# Patient Record
Sex: Female | Born: 1990
Health system: Southern US, Community
[De-identification: ages and names within clinical notes are randomized; demographics above are authoritative.]

## PROBLEM LIST (undated history)

## (undated) ENCOUNTER — Inpatient Hospital Stay (HOSPITAL_COMMUNITY): Payer: Self-pay

## (undated) DIAGNOSIS — K8020 Calculus of gallbladder without cholecystitis without obstruction: Secondary | ICD-10-CM

## (undated) DIAGNOSIS — F419 Anxiety disorder, unspecified: Secondary | ICD-10-CM

## (undated) DIAGNOSIS — R011 Cardiac murmur, unspecified: Secondary | ICD-10-CM

## (undated) DIAGNOSIS — B977 Papillomavirus as the cause of diseases classified elsewhere: Secondary | ICD-10-CM

## (undated) HISTORY — DX: Cardiac murmur, unspecified: R01.1

## (undated) HISTORY — DX: Calculus of gallbladder without cholecystitis without obstruction: K80.20

## (undated) HISTORY — PX: WISDOM TOOTH EXTRACTION: SHX21

---

## 2016-11-18 ENCOUNTER — Encounter: Payer: Self-pay | Admitting: Emergency Medicine

## 2016-11-18 ENCOUNTER — Emergency Department: Payer: Self-pay

## 2016-11-18 DIAGNOSIS — R079 Chest pain, unspecified: Secondary | ICD-10-CM | POA: Insufficient documentation

## 2016-11-18 DIAGNOSIS — R0602 Shortness of breath: Secondary | ICD-10-CM | POA: Insufficient documentation

## 2016-11-18 DIAGNOSIS — R110 Nausea: Secondary | ICD-10-CM | POA: Insufficient documentation

## 2016-11-18 DIAGNOSIS — Z5321 Procedure and treatment not carried out due to patient leaving prior to being seen by health care provider: Secondary | ICD-10-CM | POA: Insufficient documentation

## 2016-11-18 DIAGNOSIS — F17200 Nicotine dependence, unspecified, uncomplicated: Secondary | ICD-10-CM | POA: Insufficient documentation

## 2016-11-18 LAB — CBC
HCT: 41.1 % (ref 35.0–47.0)
Hemoglobin: 14.1 g/dL (ref 12.0–16.0)
MCH: 30.3 pg (ref 26.0–34.0)
MCHC: 34.2 g/dL (ref 32.0–36.0)
MCV: 88.6 fL (ref 80.0–100.0)
Platelets: 260 10*3/uL (ref 150–440)
RBC: 4.64 MIL/uL (ref 3.80–5.20)
RDW: 13.1 % (ref 11.5–14.5)
WBC: 9.6 10*3/uL (ref 3.6–11.0)

## 2016-11-18 LAB — BASIC METABOLIC PANEL WITH GFR
Anion gap: 7 (ref 5–15)
BUN: 14 mg/dL (ref 6–20)
CO2: 26 mmol/L (ref 22–32)
Calcium: 9.4 mg/dL (ref 8.9–10.3)
Chloride: 105 mmol/L (ref 101–111)
Creatinine, Ser: 0.63 mg/dL (ref 0.44–1.00)
GFR calc Af Amer: >60 mL/min (ref >60)
GFR calc non Af Amer: >60 mL/min (ref >60)
Glucose, Bld: 93 mg/dL (ref 65–99)
Potassium: 3.7 mmol/L (ref 3.5–5.1)
Sodium: 138 mmol/L (ref 135–145)

## 2016-11-18 LAB — TROPONIN I

## 2016-11-18 NOTE — ED Triage Notes (Signed)
Patient with complaint of shortness of breath that started around 4 days ago. Patient with central chest pain that started about 2 days ago. Patient states that she also has intermittent nausea.

## 2016-11-19 ENCOUNTER — Emergency Department
Admission: EM | Admit: 2016-11-19 | Discharge: 2016-11-19 | Disposition: A | Discharge status: Home / Self Care | Payer: Self-pay | Attending: Emergency Medicine | Admitting: Emergency Medicine

## 2017-01-15 ENCOUNTER — Encounter (HOSPITAL_COMMUNITY): Payer: Self-pay

## 2017-01-15 ENCOUNTER — Inpatient Hospital Stay (HOSPITAL_COMMUNITY)
Admission: AD | Admit: 2017-01-15 | Discharge: 2017-01-15 | Disposition: A | Discharge status: Home / Self Care | Payer: Managed Care, Other (non HMO) | Source: Ambulatory Visit | Attending: Obstetrics and Gynecology | Admitting: Obstetrics and Gynecology

## 2017-01-15 DIAGNOSIS — Z3A11 11 weeks gestation of pregnancy: Secondary | ICD-10-CM | POA: Diagnosis not present

## 2017-01-15 DIAGNOSIS — J029 Acute pharyngitis, unspecified: Secondary | ICD-10-CM | POA: Diagnosis not present

## 2017-01-15 DIAGNOSIS — J069 Acute upper respiratory infection, unspecified: Secondary | ICD-10-CM

## 2017-01-15 DIAGNOSIS — O26891 Other specified pregnancy related conditions, first trimester: Secondary | ICD-10-CM | POA: Diagnosis not present

## 2017-01-15 HISTORY — DX: Papillomavirus as the cause of diseases classified elsewhere: B97.7

## 2017-01-15 LAB — CBC WITH DIFFERENTIAL/PLATELET
BASOS PCT: 0 %
Basophils Absolute: 0 10*3/uL (ref 0.0–0.1)
EOS ABS: 0.2 10*3/uL (ref 0.0–0.7)
EOS PCT: 1 %
HCT: 36.1 % (ref 36.0–46.0)
Hemoglobin: 12.2 g/dL (ref 12.0–15.0)
Lymphocytes Relative: 16 %
Lymphs Abs: 2.2 10*3/uL (ref 0.7–4.0)
MCH: 30.2 pg (ref 26.0–34.0)
MCHC: 33.8 g/dL (ref 30.0–36.0)
MCV: 89.4 fL (ref 78.0–100.0)
MONO ABS: 0.4 10*3/uL (ref 0.1–1.0)
MONOS PCT: 3 %
Neutro Abs: 11.3 10*3/uL — ABNORMAL HIGH (ref 1.7–7.7)
Neutrophils Relative %: 80 %
Platelets: 293 10*3/uL (ref 150–400)
RBC: 4.04 MIL/uL (ref 3.87–5.11)
RDW: 13.2 % (ref 11.5–15.5)
WBC: 14.1 10*3/uL — ABNORMAL HIGH (ref 4.0–10.5)

## 2017-01-15 LAB — URINALYSIS, ROUTINE W REFLEX MICROSCOPIC
BILIRUBIN URINE: NEGATIVE
Glucose, UA: NEGATIVE mg/dL
KETONES UR: NEGATIVE mg/dL
Leukocytes, UA: NEGATIVE
NITRITE: NEGATIVE
PROTEIN: NEGATIVE mg/dL
pH: 6 (ref 5.0–8.0)

## 2017-01-15 LAB — POCT PREGNANCY, URINE: PREG TEST UR: POSITIVE — AB

## 2017-01-15 LAB — URINALYSIS, MICROSCOPIC (REFLEX)

## 2017-01-15 LAB — INFLUENZA PANEL BY PCR (TYPE A & B)
INFLAPCR: NEGATIVE
INFLBPCR: NEGATIVE

## 2017-01-15 LAB — RAPID STREP SCREEN (MED CTR MEBANE ONLY): Streptococcus, Group A Screen (Direct): NEGATIVE

## 2017-01-15 MED ORDER — BENZONATATE 100 MG PO CAPS: 200.0000 mg | ORAL_CAPSULE | Freq: Three times a day (TID) | ORAL | 0 refills | Status: DC | PRN

## 2017-01-15 NOTE — MAU Provider Note (Signed)
History   G3 P1011 @ 11.3 wks in with fever, nausea, vomiting, cough,  and diarrhea. Since Friday.   CSN: [id number]  Arrival date & time 01/15/17  1711   None     Chief Complaint  Patient presents with  . Fever  . Sore Throat  . Headache  . Generalized Body Aches    HPI  Past Medical History:  Diagnosis Date  . HPV in female     Past Surgical History:  Procedure Laterality Date  . WISDOM TOOTH EXTRACTION      History reviewed. No pertinent family history.  Social History  Substance Use Topics  . Smoking status: Former Games developermoker  . Smokeless tobacco: Never Used  . Alcohol use No    OB History    Gravida Para Term Preterm AB Living   3 1 1   1 1    SAB TAB Ectopic Multiple Live Births   1              Review of Systems  Constitutional: Positive for fatigue and fever.  HENT: Negative.   Eyes: Negative.   Respiratory: Positive for cough.   Cardiovascular: Negative.   Gastrointestinal: Positive for nausea and vomiting.  Endocrine: Negative.   Genitourinary: Negative.   Musculoskeletal: Negative.   Skin: Negative.     Allergies  Patient has no known allergies.  Home Medications    BP 125/81 (BP Location: Right Arm)   Pulse 108   Temp 98.2 F (36.8 C) (Oral)   Resp 18   Ht 5' (1.524 m)   Wt 150 lb 12.8 oz (68.4 kg)   LMP 10/28/2016   BMI 29.45 kg/m   Physical Exam  Constitutional: She is oriented to person, place, and time. She appears well-developed and well-nourished.  HENT:  Head: Normocephalic.  Eyes: Pupils are equal, round, and reactive to light.  Neck: Normal range of motion.  Cardiovascular: Normal rate, regular rhythm, normal heart sounds and intact distal pulses.   Pulmonary/Chest: Effort normal and breath sounds normal.  Abdominal: Soft. Bowel sounds are normal.  Musculoskeletal: Normal range of motion.  Neurological: She is alert and oriented to person, place, and time. She has normal reflexes.  Skin: Skin is warm and dry.   Psychiatric: She has a normal mood and affect. Her behavior is normal. Judgment and thought content normal.    MAU Course  Procedures (including critical care time)  Labs Reviewed  CBC WITH DIFFERENTIAL/PLATELET - Abnormal; Notable for the following:       Result Value   WBC 14.1 (*)    Neutro Abs 11.3 (*)    All other components within normal limits  POCT PREGNANCY, URINE - Abnormal; Notable for the following:    Preg Test, Ur POSITIVE (*)    All other components within normal limits  RAPID STREP SCREEN (NOT AT [facility])  URINALYSIS, ROUTINE W REFLEX MICROSCOPIC  INFLUENZA PANEL BY PCR (TYPE A & B)   No results found.   No diagnosis found.    MDM  VSS, LCTAB, influenza screen neg, rapid strep neg. Will d/c home with med for cough. POC discussed with Dr. Jackelyn KnifeMeisinger.

## 2017-01-15 NOTE — MAU Note (Addendum)
Patient presents with congestion, headache, body aches, vomiting, sore throat, fever of 100.1 to 100.3 did not get flu shot. Having diarrhea every hour to hour and 1/2

## 2017-01-15 NOTE — MAU Note (Signed)
Pt took robitussin for cough between 1230 & 1400 today, hasn't taken anything for HA, vomiting, or diarrhea.

## 2017-01-15 NOTE — Discharge Instructions (Signed)
Upper Respiratory Infection, Adult Most upper respiratory infections (URIs) are caused by a virus. A URI affects the nose, throat, and upper air passages. The most common type of URI is often called "the common cold." Follow these instructions at home:  Take medicines only as told by your doctor.  Gargle warm saltwater or take cough drops to comfort your throat as told by your doctor.  Use a warm mist humidifier or inhale steam from a shower to increase air moisture. This may make it easier to breathe.  Drink enough fluid to keep your pee (urine) clear or pale yellow.  Eat soups and other clear broths.  Have a healthy diet.  Rest as needed.  Go back to work when your fever is gone or your doctor says it is okay.  You may need to stay home longer to avoid giving your URI to others.  You can also wear a face mask and wash your hands often to prevent spread of the virus.  Use your inhaler more if you have asthma.  Do not use any tobacco products, including cigarettes, chewing tobacco, or electronic cigarettes. If you need help quitting, ask your doctor. Contact a doctor if:  You are getting worse, not better.  Your symptoms are not helped by medicine.  You have chills.  You are getting more short of breath.  You have brown or red mucus.  You have yellow or brown discharge from your nose.  You have pain in your face, especially when you bend forward.  You have a fever.  You have puffy (swollen) neck glands.  You have pain while swallowing.  You have white areas in the back of your throat. Get help right away if:  You have very bad or constant:  Headache.  Ear pain.  Pain in your forehead, behind your eyes, and over your cheekbones (sinus pain).  Chest pain.  You have long-lasting (chronic) lung disease and any of the following:  Wheezing.  Long-lasting cough.  Coughing up blood.  A change in your usual mucus.  You have a stiff neck.  You have  changes in your:  Vision.  Hearing.  Thinking.  Mood. This information is not intended to replace advice given to you by your health care provider. Make sure you discuss any questions you have with your health care provider. Document Released: 04/18/2008 Document Revised: 07/03/2016 Document Reviewed: 02/05/2014 Elsevier Interactive Patient Education  2017 Elsevier Inc.  

## 2017-01-18 LAB — CULTURE, GROUP A STREP (THRC)

## 2017-10-27 ENCOUNTER — Encounter (HOSPITAL_COMMUNITY): Payer: Self-pay

## 2018-06-02 ENCOUNTER — Other Ambulatory Visit: Payer: Self-pay

## 2018-06-02 ENCOUNTER — Encounter (HOSPITAL_COMMUNITY): Payer: Self-pay | Admitting: Emergency Medicine

## 2018-06-02 ENCOUNTER — Emergency Department (HOSPITAL_COMMUNITY): Payer: Medicaid Other

## 2018-06-02 ENCOUNTER — Emergency Department (HOSPITAL_COMMUNITY)
Admission: EM | Admit: 2018-06-02 | Discharge: 2018-06-03 | Disposition: A | Discharge status: Home / Self Care | Payer: Medicaid Other | Attending: Emergency Medicine | Admitting: Emergency Medicine

## 2018-06-02 DIAGNOSIS — G43909 Migraine, unspecified, not intractable, without status migrainosus: Secondary | ICD-10-CM | POA: Insufficient documentation

## 2018-06-02 DIAGNOSIS — Z79899 Other long term (current) drug therapy: Secondary | ICD-10-CM | POA: Insufficient documentation

## 2018-06-02 DIAGNOSIS — F17210 Nicotine dependence, cigarettes, uncomplicated: Secondary | ICD-10-CM | POA: Diagnosis not present

## 2018-06-02 DIAGNOSIS — R51 Headache: Secondary | ICD-10-CM | POA: Diagnosis present

## 2018-06-02 DIAGNOSIS — G43009 Migraine without aura, not intractable, without status migrainosus: Secondary | ICD-10-CM

## 2018-06-02 HISTORY — DX: Anxiety disorder, unspecified: F41.9

## 2018-06-02 LAB — CBC
HCT: 40.8 % (ref 36.0–46.0)
HEMOGLOBIN: 12.8 g/dL (ref 12.0–15.0)
MCH: 28.4 pg (ref 26.0–34.0)
MCHC: 31.4 g/dL (ref 30.0–36.0)
MCV: 90.7 fL (ref 78.0–100.0)
PLATELETS: 310 10*3/uL (ref 150–400)
RBC: 4.5 MIL/uL (ref 3.87–5.11)
RDW: 12.4 % (ref 11.5–15.5)
WBC: 11.4 10*3/uL — AB (ref 4.0–10.5)

## 2018-06-02 LAB — BASIC METABOLIC PANEL
Anion gap: 11 (ref 5–15)
BUN: 21 mg/dL — ABNORMAL HIGH (ref 6–20)
CALCIUM: 9.3 mg/dL (ref 8.9–10.3)
CHLORIDE: 101 mmol/L (ref 98–111)
CO2: 26 mmol/L (ref 22–32)
CREATININE: 0.66 mg/dL (ref 0.44–1.00)
GFR calc non Af Amer: >60 mL/min (ref >60)
Glucose, Bld: 113 mg/dL — ABNORMAL HIGH (ref 70–99)
Potassium: 4.2 mmol/L (ref 3.5–5.1)
Sodium: 138 mmol/L (ref 135–145)

## 2018-06-02 LAB — I-STAT BETA HCG BLOOD, ED (MC, WL, AP ONLY): I-stat hCG, quantitative: <5 m[IU]/mL (ref <5)

## 2018-06-02 LAB — CBG MONITORING, ED: GLUCOSE-CAPILLARY: 114 mg/dL — AB (ref 70–99)

## 2018-06-02 MED ORDER — KETOROLAC TROMETHAMINE 30 MG/ML IJ SOLN
30.0000 mg | Freq: Once | INTRAMUSCULAR | Status: AC
  Administered 2018-06-02: 30 mg via INTRAVENOUS
  Filled 2018-06-02: qty 1

## 2018-06-02 MED ORDER — SODIUM CHLORIDE 0.9 % IV BOLUS
1000.0000 mL | Freq: Once | INTRAVENOUS | Status: AC
  Administered 2018-06-02: 1000 mL via INTRAVENOUS

## 2018-06-02 MED ORDER — DIPHENHYDRAMINE HCL 50 MG/ML IJ SOLN
25.0000 mg | Freq: Once | INTRAMUSCULAR | Status: AC
  Administered 2018-06-02: 25 mg via INTRAVENOUS
  Filled 2018-06-02: qty 1

## 2018-06-02 MED ORDER — PROCHLORPERAZINE EDISYLATE 10 MG/2ML IJ SOLN
10.0000 mg | Freq: Once | INTRAMUSCULAR | Status: AC
Start: 2018-06-02 — End: 2018-06-02
  Administered 2018-06-02: 10 mg via INTRAVENOUS
  Filled 2018-06-02: qty 2
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
## 2018-06-02 NOTE — ED Notes (Signed)
Patient transported to CT 
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
## 2018-06-02 NOTE — ED Notes (Signed)
ED Provider at bedside. 

## 2018-06-03 NOTE — Discharge Instructions (Signed)
Return to the emergency room for any worsening symptoms, severe headache, lightheadedness, loss of consciousness, chest pain or shortness of breath.

## 2018-08-09 ENCOUNTER — Encounter (HOSPITAL_BASED_OUTPATIENT_CLINIC_OR_DEPARTMENT_OTHER): Payer: Self-pay | Admitting: Emergency Medicine

## 2018-08-09 ENCOUNTER — Emergency Department (HOSPITAL_BASED_OUTPATIENT_CLINIC_OR_DEPARTMENT_OTHER)
Admission: EM | Admit: 2018-08-09 | Discharge: 2018-08-09 | Disposition: A | Discharge status: Home / Self Care | Payer: Medicaid Other | Attending: Emergency Medicine | Admitting: Emergency Medicine

## 2018-08-09 ENCOUNTER — Other Ambulatory Visit: Payer: Self-pay

## 2018-08-09 DIAGNOSIS — F17200 Nicotine dependence, unspecified, uncomplicated: Secondary | ICD-10-CM | POA: Diagnosis not present

## 2018-08-09 DIAGNOSIS — H538 Other visual disturbances: Secondary | ICD-10-CM | POA: Insufficient documentation

## 2018-08-09 DIAGNOSIS — R1110 Vomiting, unspecified: Secondary | ICD-10-CM | POA: Diagnosis not present

## 2018-08-09 DIAGNOSIS — F419 Anxiety disorder, unspecified: Secondary | ICD-10-CM | POA: Diagnosis not present

## 2018-08-09 DIAGNOSIS — R202 Paresthesia of skin: Secondary | ICD-10-CM

## 2018-08-09 DIAGNOSIS — R079 Chest pain, unspecified: Secondary | ICD-10-CM | POA: Diagnosis not present

## 2018-08-09 LAB — URINALYSIS, ROUTINE W REFLEX MICROSCOPIC
Bilirubin Urine: NEGATIVE
GLUCOSE, UA: NEGATIVE mg/dL
KETONES UR: NEGATIVE mg/dL
LEUKOCYTES UA: NEGATIVE
NITRITE: NEGATIVE
PH: 6 (ref 5.0–8.0)
Protein, ur: NEGATIVE mg/dL
Specific Gravity, Urine: 1.02 (ref 1.005–1.030)

## 2018-08-09 LAB — PREGNANCY, URINE: Preg Test, Ur: NEGATIVE

## 2018-08-09 LAB — URINALYSIS, MICROSCOPIC (REFLEX)

## 2018-08-09 NOTE — Discharge Instructions (Addendum)
Please see your primary doctor in next 2 weeks if there is no improvement in your symptoms   RETURN IMMEDIATELY IF you develop a sudden, severe headache or confusion, become poorly responsive or faint, develop a fever above 100.10F or problem breathing, have a change in speech, vision, swallowing, or understanding, or develop new weakness, numbness, tingling, incoordination, or have a seizure.

## 2018-08-09 NOTE — ED Triage Notes (Signed)
PT is c/o nausea and vomiting for past 4 days  Pt states she has been having chest pain off and on for the past month  Feels like something is crushing her chest and sometimes her left arm goes numb  Pt has hx of chronic migraines  Pt states lately she gets a sensation that runs up the back of her neck then up the back of her head and runs to the front of her head  Pt states it feels like a tickling sensation  Pt states when that happens her head will start shaking  Pt has hx of anxiety

## 2018-08-10 NOTE — ED Provider Notes (Signed)
[facility] EMERGENCY DEPARTMENT Provider Note   CSN: [id number] Arrival date & time: 08/09/18  2100     History   Chief Complaint Chief Complaint  Patient presents with  . Emesis  . Chest Pain    HPI Sandra Khan is a 27 y.o. female.  The history is provided by the patient.  Emesis   This is a new problem. The problem has been gradually improving. There has been no fever. Pertinent negatives include no diarrhea and no fever.   Patient presents for multiple complaints.  She reports over 2 days ago she had an episode of chest pain with numbness in her left arm, but none at this time.  She also reports at times she feels like someone is tickling her neck and the sensation runs into her head that her head starts shaking.  No severe headache associated with this.  She reports at times she may have blurred vision, but denies visual loss, denies diplopia.  No focal weakness.  No speech deficit. No falls. Reports vomiting tonight.  She was history of chronic migraines but none recently. Also reports frequent episodes of anxiety, but refused to take medications due to sleepiness She reports she would like to be checked for COPD, stroke, heart attack Past Medical History:  Diagnosis Date  . Anxiety   . HPV in female     There are no active problems to display for this patient.   Past Surgical History:  Procedure Laterality Date  . WISDOM TOOTH EXTRACTION       OB History    Gravida  3   Para  1   Term  1   Preterm      AB  1   Living  1     SAB  1   TAB      Ectopic      Multiple      Live Births               Home Medications    Prior to Admission medications   Medication Sig Start Date End Date Taking? Authorizing Provider  benzonatate (TESSALON PERLES) 100 MG capsule Take 2 capsules (200 mg total) by mouth 3 (three) times daily as needed for cough. Patient not taking: Reported on 06/02/2018 01/15/17   Katrinka Blazing, [place], CNM    ibuprofen (ADVIL,MOTRIN) 200 MG tablet Take 800 mg by mouth every 6 (six) hours as needed for headache.    [provider]    Family History History reviewed. No pertinent family history.  Social History Social History   Tobacco Use  . Smoking status: Current Every Day Smoker  . Smokeless tobacco: Never Used  Substance Use Topics  . Alcohol use: No  . Drug use: No     Allergies   Patient has no known allergies.   Review of Systems Review of Systems  Constitutional: Negative for fever.  Gastrointestinal: Negative for diarrhea.  Genitourinary: Negative for dysuria.  Musculoskeletal: Positive for neck pain.  Neurological: Negative for weakness.  All other systems reviewed and are negative.    Physical Exam Updated Vital Signs BP 121/74 (BP Location: Right Arm)   Pulse 70   Temp 98.2 F (36.8 C) (Oral)   Resp 16   Ht 1.549 m (5\' 1" )   Wt 73 kg   LMP 08/02/2018 (Exact Date)   SpO2 100%   BMI 30.42 kg/m   Physical Exam CONSTITUTIONAL: Well developed/well nourished HEAD: Normocephalic/atraumatic EYES: EOMI/PERRL, no nystagmus, no  ptosis ENMT: Mucous membranes moist NECK: supple no meningeal signs, no bruits SPINE/BACK:entire spine nontender CV: S1/S2 noted, no murmurs/rubs/gallops noted LUNGS: Lungs are clear to auscultation bilaterally, no apparent distress ABDOMEN: soft, nontender, no rebound or guarding GU:no cva tenderness NEURO:Awake/alert, face symmetric, no arm or leg drift is noted Equal 5/5 strength with shoulder abduction, elbow flex/extension, wrist flex/extension in upper extremities and equal hand grips bilaterally Equal 5/5 strength with hip flexion,knee flex/extension, foot dorsi/plantar flexion Cranial nerves 3/4/5/6/05/22/09/11/12 tested and intact Gait normal without ataxia No past pointing Sensation to light touch intact in all extremities EXTREMITIES: pulses normal, full ROM SKIN: warm, color normal PSYCH: no abnormalities of  mood noted, alert and oriented to situation    ED Treatments / Results  Labs (all labs ordered are listed, but only abnormal results are displayed) Labs Reviewed  URINALYSIS, ROUTINE W REFLEX MICROSCOPIC - Abnormal; Notable for the following components:      Result Value   APPearance HAZY (*)    Hgb urine dipstick TRACE (*)    All other components within normal limits  URINALYSIS, MICROSCOPIC (REFLEX) - Abnormal; Notable for the following components:   Bacteria, UA MANY (*)    All other components within normal limits  PREGNANCY, URINE    EKG EKG Interpretation  Date/Time:  Thursday August 09 2018 21:18:36 EDT Ventricular Rate:  87 PR Interval:    QRS Duration: 81 QT Interval:  349 QTC Calculation: 420 R Axis:   69 Text Interpretation:  Sinus rhythm No significant change since last tracing Confirmed by Melene Plan [phone]) on 08/09/2018 10:43:15 PM   Radiology No results found.  Procedures Procedures  Medications Ordered in ED Medications - No data to display   Initial Impression / Assessment and Plan / ED Course  I have reviewed the triage vital signs and the nursing notes.  Pertinent labs  results that were available during my care of the patient were reviewed by me and considered in my medical decision making (see chart for details).     She is very well-appearing. She has no focal neuro deficits.  She is awake alert and ambulatory.  She has no symptoms at this time.  Defer further work-up.  I did advise her close follow-up with her PCP, she may need to have further neuroimaging if the symptoms in her neck or head begin.  At this time she has no signs of any acute neurologic emergency. For chest pain, no EKG changes, no recent chest pain the past 2 days.  Defer further work-up Final Clinical Impressions(s) / ED Diagnoses   Final diagnoses:  Paresthesia    ED Discharge Orders    None       Zadie Rhine, MD 08/10/18 [phone]

## 2019-05-15 IMAGING — CT CT HEAD W/O CM
4 series · 15 of 47 positions shown, 17 images · non-contrast
Comparison: None.

CLINICAL DATA: 26-year-old female with headache.

EXAM:
CT HEAD WITHOUT CONTRAST
TECHNIQUE: Contiguous axial images were obtained from the base of the skull
through the vertex without intravenous contrast.

[Series 3: head wo · axial · 0.44mm/px · z∈[-183,-63]mm · 7 of 33 slices shown, 9 images]
[im 5/33  brain]
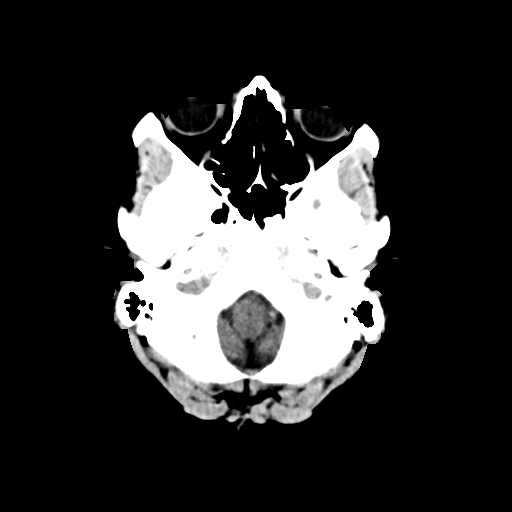
[im 5/33  bone]
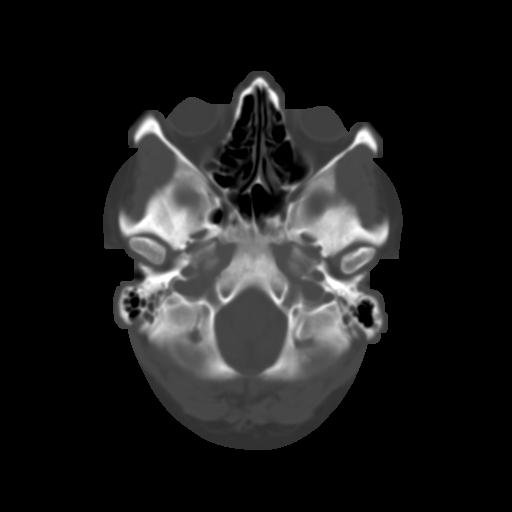
[im 9/33  brain]
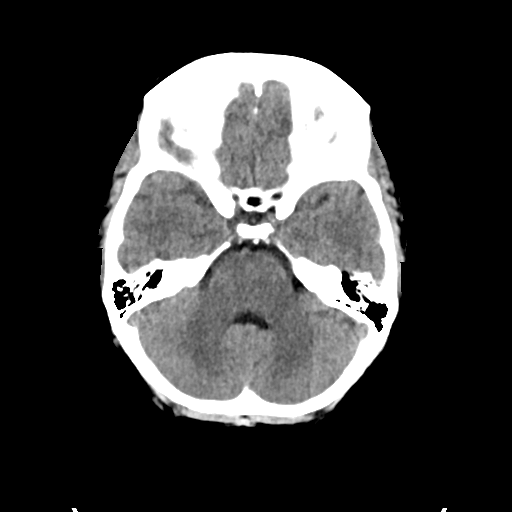
[im 13/33  brain]
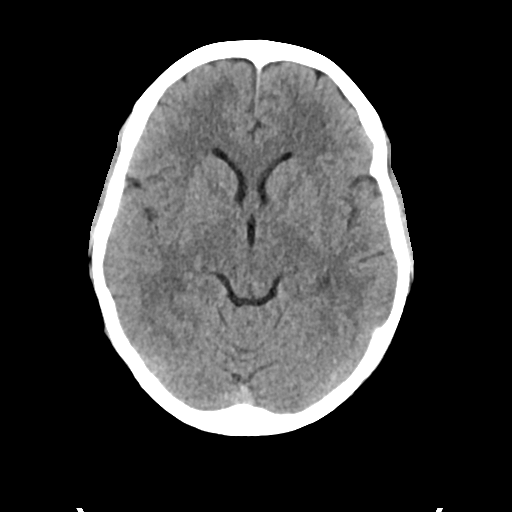
[im 17/33  brain]
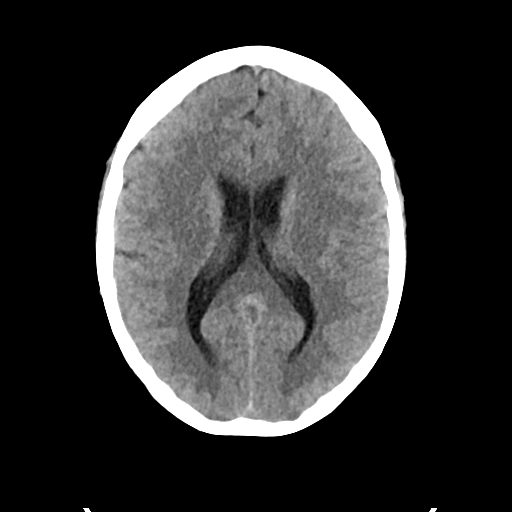
[im 21/33  brain]
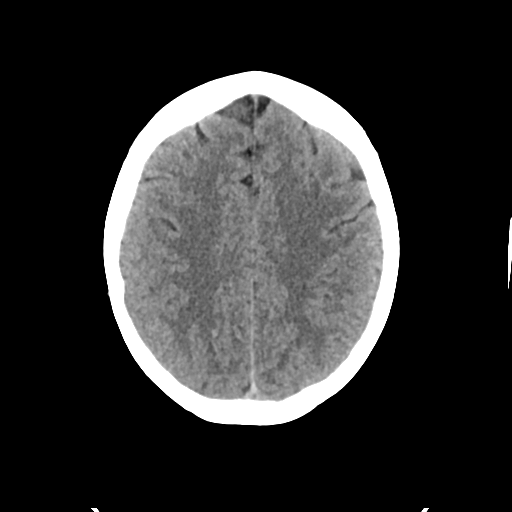
[im 21/33  bone]
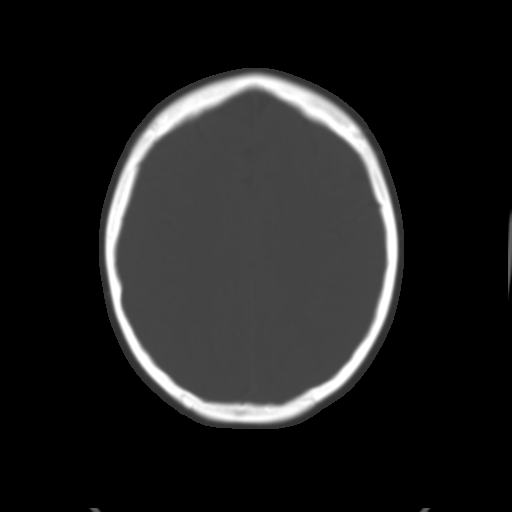
[im 25/33  brain]
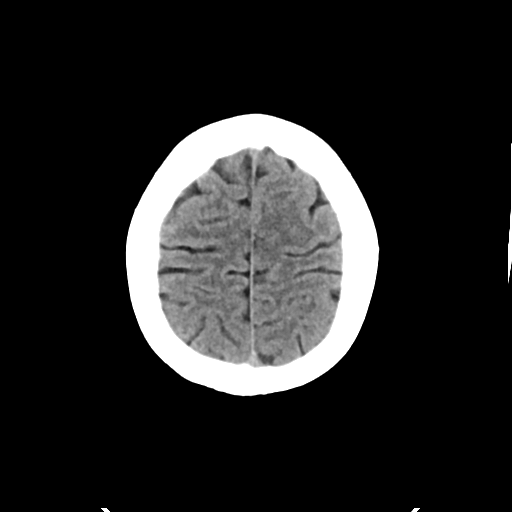
[im 29/33  brain]
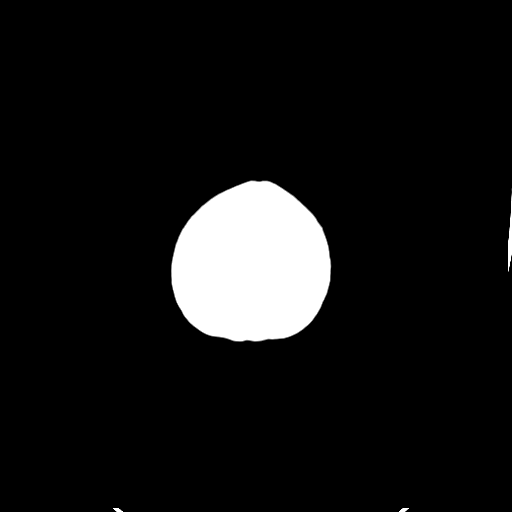

[Series 4: head bone · axial · 0.44mm/px · z∈[-187,-171]mm · 2 of 82 slices shown]
[im 9/82  bone]
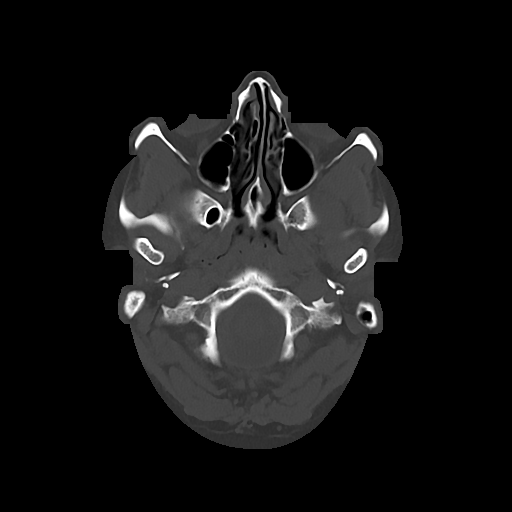
[im 17/82  bone]
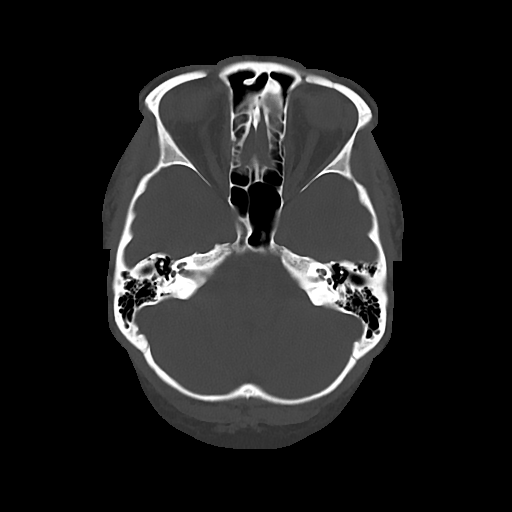

[Series 5: cor soft · coronal · 0.32mm/px · 3 of 67 slices shown]
[im 23/67  brain]
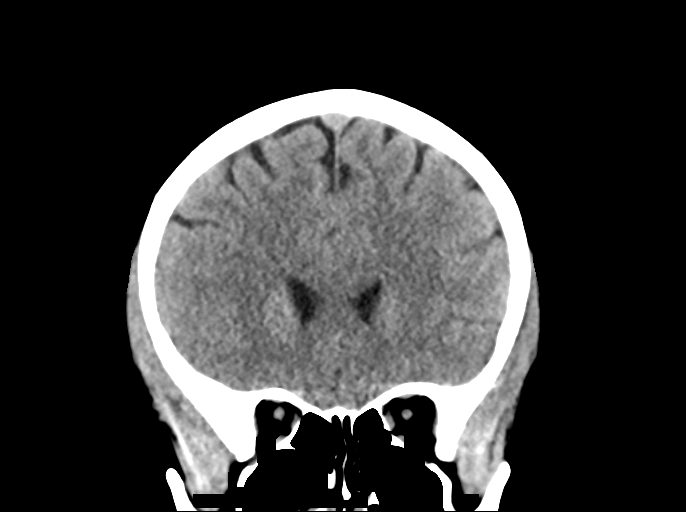
[im 30/67  brain]
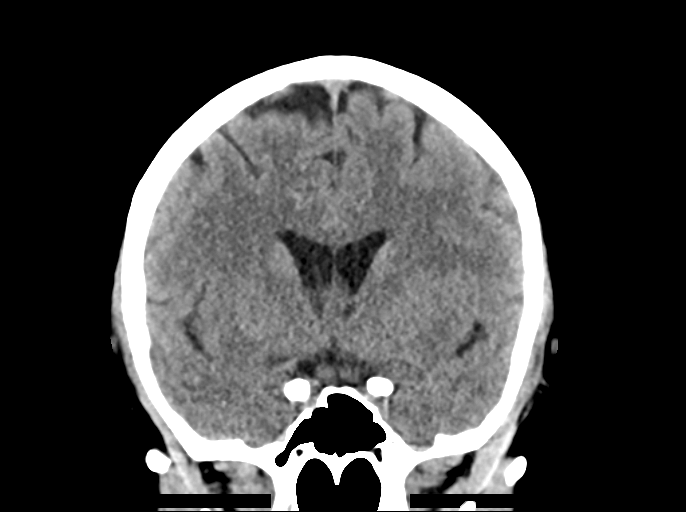
[im 37/67  brain]
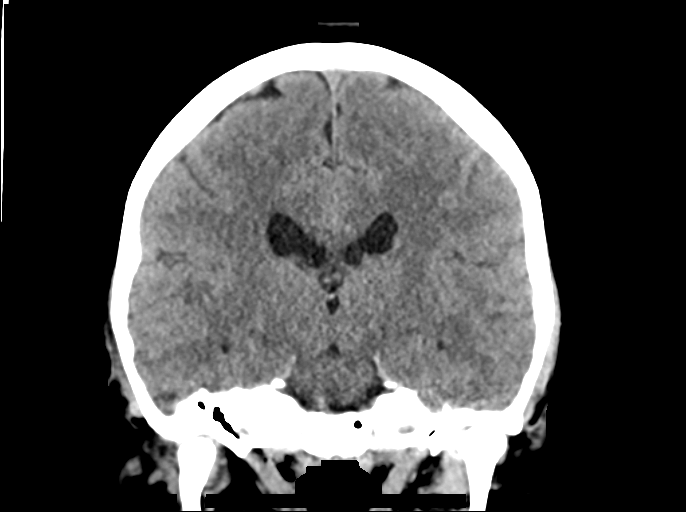

[Series 6: sag soft · sagittal · 0.32mm/px · 3 of 66 slices shown]
[im 22/66  brain]
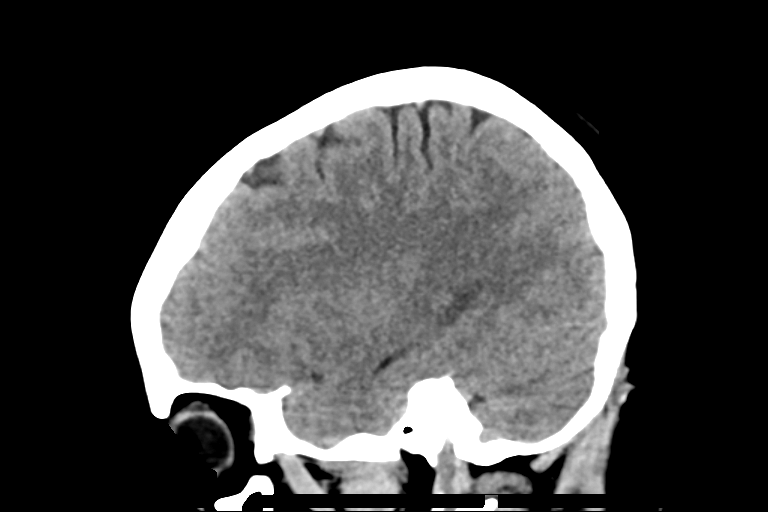
[im 33/66  brain]
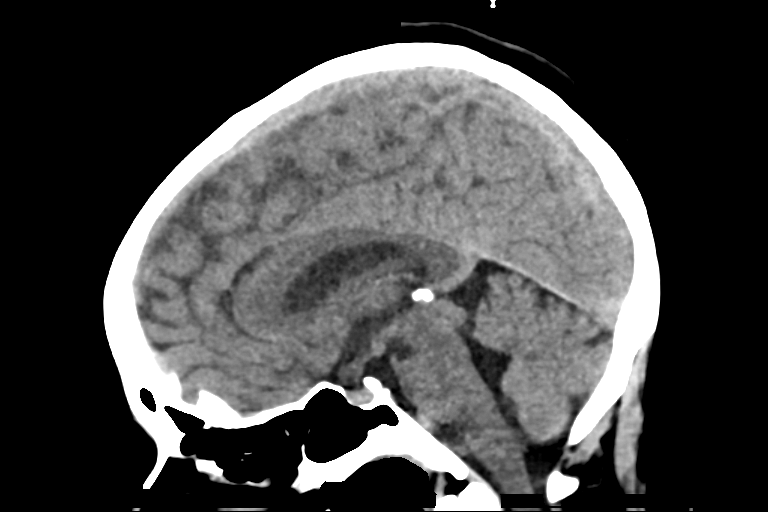
[im 44/66  brain]
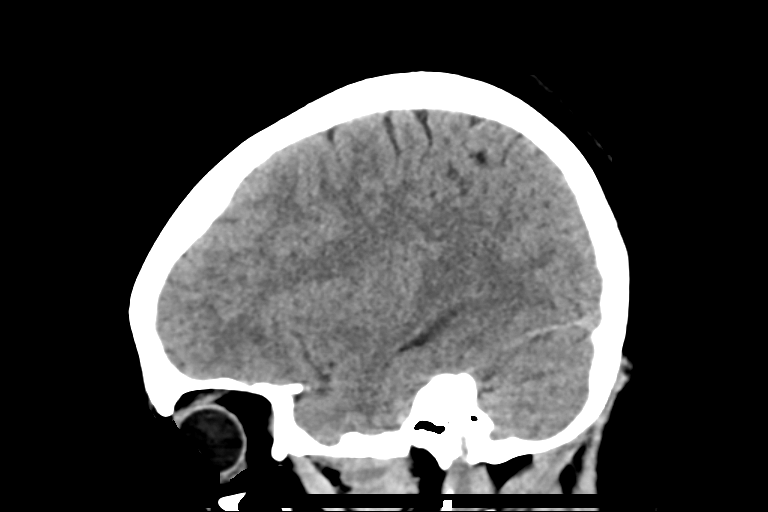

[15 of 47 positions shown; findings below may reference images not displayed]

FINDINGS: Brain: No evidence of acute infarction, hemorrhage, hydrocephalus,
extra-axial collection or mass lesion/mass effect.

Vascular: No hyperdense vessel or unexpected calcification.

Skull: Normal. Negative for fracture or focal lesion.

Sinuses/Orbits: No acute finding.

Other: None
IMPRESSION: Normal noncontrast CT of the brain.

## 2020-11-14 ENCOUNTER — Ambulatory Visit: Admit: 2020-11-14 | Discharge status: Against Medical Advice | Payer: Self-pay

## 2020-11-14 ENCOUNTER — Other Ambulatory Visit: Payer: Self-pay

## 2020-11-14 ENCOUNTER — Ambulatory Visit
Admission: EM | Admit: 2020-11-14 | Discharge: 2020-11-14 | Disposition: A | Discharge status: Home / Self Care | Payer: Medicaid Other | Attending: Emergency Medicine | Admitting: Emergency Medicine

## 2020-11-14 DIAGNOSIS — L500 Allergic urticaria: Secondary | ICD-10-CM | POA: Diagnosis not present

## 2020-11-14 MED ORDER — METHYLPREDNISOLONE SODIUM SUCC 125 MG IJ SOLR
125.0000 mg | Freq: Once | INTRAMUSCULAR | Status: AC
  Administered 2020-11-14: 125 mg via INTRAMUSCULAR

## 2020-11-14 NOTE — ED Triage Notes (Signed)
Patient complains of a red welted rash predominantly on her hands and feet since yesterday. Pt also says it is affecting her eyes. Py is aox4 and ambulatory.

## 2020-11-14 NOTE — ED Provider Notes (Signed)
[facility]    CSN: [id number] Arrival date & time: 11/14/20  1122      History   Chief Complaint Chief Complaint  Patient presents with  . Rash    Since yesterday    HPI Sandra Khan is a 30 y.o. female  Presenting for hives x yesterday.  Believes to to be related to Mucinex as this has happened in the past.  No SHOB, CP, angioedema.  Requesting steroid shot.  Past Medical History:  Diagnosis Date  . Anxiety   . HPV in female     There are no problems to display for this patient.   Past Surgical History:  Procedure Laterality Date  . WISDOM TOOTH EXTRACTION      OB History    Gravida  3   Para  1   Term  1   Preterm      AB  1   Living  1     SAB  1   IAB      Ectopic      Multiple      Live Births               Home Medications    Prior to Admission medications   Medication Sig Start Date End Date Taking? Authorizing Provider  ibuprofen (ADVIL,MOTRIN) 200 MG tablet Take 800 mg by mouth every 6 (six) hours as needed for headache.   Yes [provider]    Family History History reviewed. No pertinent family history.  Social History Social History   Tobacco Use  . Smoking status: Current Every Day Smoker  . Smokeless tobacco: Never Used  Vaping Use  . Vaping Use: Never used  Substance Use Topics  . Alcohol use: No  . Drug use: No     Allergies   Mucinex sinus-max cng-pr-pn-cg [oxymetazoline & pe-apap-gg-dm]   Review of Systems Review of Systems  Constitutional: Negative for fatigue and fever.  HENT: Negative for ear pain, sinus pain, sore throat and voice change.   Eyes: Negative for pain, redness and visual disturbance.  Respiratory: Negative for cough and shortness of breath.   Cardiovascular: Negative for chest pain and palpitations.  Gastrointestinal: Negative for abdominal pain, diarrhea and vomiting.  Musculoskeletal: Negative for arthralgias and myalgias.  Skin: Positive for rash.  Negative for wound.  Neurological: Negative for syncope and headaches.     Physical Exam Triage Vital Signs ED Triage Vitals  Enc Vitals Group     BP 11/14/20 1207 118/81     Pulse Rate 11/14/20 1207 78     Resp 11/14/20 1207 18     Temp 11/14/20 1207 97.8 F (36.6 C)     Temp Source 11/14/20 1207 Oral     SpO2 11/14/20 1207 97 %     Weight --      Height --      Head Circumference --      Peak Flow --      Pain Score 11/14/20 1209 0     Pain Loc --      Pain Edu? --      Excl. in GC? --    No data found.  Updated Vital Signs BP 118/81 (BP Location: Right Arm)   Pulse 78   Temp 97.8 F (36.6 C) (Oral)   Resp 18   LMP 10/28/2020 (Approximate)   SpO2 97%   Visual Acuity Right Eye Distance:   Left Eye Distance:   Bilateral Distance:  Right Eye Near:   Left Eye Near:    Bilateral Near:     Physical Exam Constitutional:      General: She is not in acute distress. HENT:     Head: Normocephalic and atraumatic.     Mouth/Throat:     Mouth: Mucous membranes are moist.     Pharynx: Oropharynx is clear.     Comments: Airway patent Eyes:     General: No scleral icterus.    Pupils: Pupils are equal, round, and reactive to light.  Cardiovascular:     Rate and Rhythm: Normal rate.  Pulmonary:     Effort: Pulmonary effort is normal.  Skin:    Coloration: Skin is not jaundiced or pale.     Findings: Rash present.     Comments: Urticaria to legs, arms, abdomen, back  Neurological:     Mental Status: She is alert and oriented to person, place, and time.      UC Treatments / Results  Labs (all labs ordered are listed, but only abnormal results are displayed) Labs Reviewed - No data to display  EKG   Radiology No results found.  Procedures Procedures (including critical care time)  Medications Ordered in UC Medications  methylPREDNISolone sodium succinate (SOLU-MEDROL) 125 mg/2 mL injection 125 mg (125 mg Intramuscular Given 11/14/20 1256)     Initial Impression / Assessment and Plan / UC Course  I have reviewed the triage vital signs and the nursing notes.  Pertinent labs & imaging results that were available during my care of the patient were reviewed by me and considered in my medical decision making (see chart for details).     Will treat for likely allergic urticaria.  Tolerated solumedrol well.  Return precautions discussed, pt verbalized understanding and is agreeable to plan. Final Clinical Impressions(s) / UC Diagnoses   Final diagnoses:  Allergic urticaria     Discharge Instructions     Guaifenesin     ED Prescriptions    None     PDMP not reviewed this encounter.   Hall-Potvin, [place], [place] 11/14/20 1306

## 2020-11-14 NOTE — Discharge Instructions (Addendum)
Guaifenesin

## 2021-02-08 ENCOUNTER — Encounter: Payer: Self-pay | Admitting: Emergency Medicine

## 2021-02-08 ENCOUNTER — Emergency Department
Admission: EM | Admit: 2021-02-08 | Discharge: 2021-02-08 | Disposition: A | Discharge status: Home / Self Care | Payer: Medicaid Other | Attending: Emergency Medicine | Admitting: Emergency Medicine

## 2021-02-08 ENCOUNTER — Other Ambulatory Visit: Payer: Self-pay

## 2021-02-08 ENCOUNTER — Emergency Department: Payer: Medicaid Other

## 2021-02-08 DIAGNOSIS — O039 Complete or unspecified spontaneous abortion without complication: Secondary | ICD-10-CM | POA: Diagnosis not present

## 2021-02-08 DIAGNOSIS — O99331 Smoking (tobacco) complicating pregnancy, first trimester: Secondary | ICD-10-CM | POA: Diagnosis not present

## 2021-02-08 DIAGNOSIS — N939 Abnormal uterine and vaginal bleeding, unspecified: Secondary | ICD-10-CM

## 2021-02-08 DIAGNOSIS — O4691 Antepartum hemorrhage, unspecified, first trimester: Secondary | ICD-10-CM | POA: Diagnosis present

## 2021-02-08 DIAGNOSIS — F17200 Nicotine dependence, unspecified, uncomplicated: Secondary | ICD-10-CM | POA: Insufficient documentation

## 2021-02-08 LAB — BASIC METABOLIC PANEL WITH GFR
Anion gap: 8 (ref 5–15)
BUN: 14 mg/dL (ref 6–20)
CO2: 25 mmol/L (ref 22–32)
Calcium: 9.2 mg/dL (ref 8.9–10.3)
Chloride: 106 mmol/L (ref 98–111)
Creatinine, Ser: 0.56 mg/dL (ref 0.44–1.00)
GFR, Estimated: >60 mL/min
Glucose, Bld: 78 mg/dL (ref 70–99)
Potassium: 3.9 mmol/L (ref 3.5–5.1)
Sodium: 139 mmol/L (ref 135–145)

## 2021-02-08 LAB — CBC WITH DIFFERENTIAL/PLATELET
Abs Immature Granulocytes: 0.06 10*3/uL (ref 0.00–0.07)
Basophils Absolute: 0 10*3/uL (ref 0.0–0.1)
Basophils Relative: 0 %
Eosinophils Absolute: 0.2 10*3/uL (ref 0.0–0.5)
Eosinophils Relative: 1 %
HCT: 38.7 % (ref 36.0–46.0)
Hemoglobin: 12.7 g/dL (ref 12.0–15.0)
Immature Granulocytes: 0 %
Lymphocytes Relative: 23 %
Lymphs Abs: 3.1 10*3/uL (ref 0.7–4.0)
MCH: 29.2 pg (ref 26.0–34.0)
MCHC: 32.8 g/dL (ref 30.0–36.0)
MCV: 89 fL (ref 80.0–100.0)
Monocytes Absolute: 0.7 10*3/uL (ref 0.1–1.0)
Monocytes Relative: 5 %
Neutro Abs: 9.4 10*3/uL — ABNORMAL HIGH (ref 1.7–7.7)
Neutrophils Relative %: 71 %
Platelets: 316 10*3/uL (ref 150–400)
RBC: 4.35 MIL/uL (ref 3.87–5.11)
RDW: 13.1 % (ref 11.5–15.5)
WBC: 13.5 10*3/uL — ABNORMAL HIGH (ref 4.0–10.5)
nRBC: 0 % (ref 0.0–0.2)

## 2021-02-08 LAB — ABO/RH: ABO/RH(D): O POS

## 2021-02-08 LAB — POC URINE PREG, ED: Preg Test, Ur: NEGATIVE

## 2021-02-08 LAB — HCG, QUANTITATIVE, PREGNANCY: hCG, Beta Chain, Quant, S: 29 m[IU]/mL — ABNORMAL HIGH

## 2021-02-08 NOTE — ED Triage Notes (Signed)
Has had positive home pregnancy tests and one blood pregnancy test at PCP office.  Has history of miscarriages.  Reports vaginal bleeding today.  Denies cramping.

## 2021-02-08 NOTE — ED Notes (Signed)
See triage note  Presents with some vaginal bleeding   States she is approx 5 weeks preg   States she noticed some light bleeding at first then became a little heavier

## 2021-02-09 NOTE — ED Provider Notes (Signed)
[facility] Emergency Department Provider Note   ____________________________________________   Event Date/Time   First MD Initiated Contact with Patient 02/08/21 1538     (approximate)  I have reviewed the triage vital signs and the nursing notes.   HISTORY  Chief Complaint Vaginal Bleeding    HPI Sandra Khan is a 30 y.o. female with a stated past medical history of HPV, anxiety, multiple miscarriages in the past who presents for vaginal bleeding after a positive pregnancy test approximately 1 week prior to arrival.  Patient states that this feels similar to miscarriages that she has had in the past.  Just prior to interview, patient states that she passed a large "clot" that she feels is passage of the fetal material.  Patient states that her bleeding as well as her abdominal pain decreased after passing this large amount of tissue.  Patient currently denies any vision changes, tinnitus, difficulty speaking, facial droop, sore throat, chest pain, shortness of breath, nausea/vomiting/diarrhea, dysuria, or weakness/numbness/paresthesias in any extremity         Past Medical History:  Diagnosis Date  . Anxiety   . HPV in female     There are no problems to display for this patient.   Past Surgical History:  Procedure Laterality Date  . WISDOM TOOTH EXTRACTION      Prior to Admission medications   Not on File    Allergies Mucinex sinus-max cng-pr-pn-cg [oxymetazoline & pe-apap-gg-dm]  No family history on file.  Social History Social History   Tobacco Use  . Smoking status: Current Every Day Smoker  . Smokeless tobacco: Never Used  Vaping Use  . Vaping Use: Never used  Substance Use Topics  . Alcohol use: No  . Drug use: No    Review of Systems Constitutional: No fever/chills Eyes: No visual changes. ENT: No sore throat. Cardiovascular: Denies chest pain. Respiratory: Denies shortness of breath. Gastrointestinal: Endorses  abdominal pain.  No nausea, no vomiting.  No diarrhea. Genitourinary: Negative for dysuria.  Endorses vaginal bleeding Musculoskeletal: Negative for acute arthralgias Skin: Negative for rash. Neurological: Negative for headaches, weakness/numbness/paresthesias in any extremity Psychiatric: Negative for suicidal ideation/homicidal ideation   ____________________________________________   PHYSICAL EXAM:  VITAL SIGNS: ED Triage Vitals  Enc Vitals Group     BP 02/08/21 1456 122/87     Pulse Rate 02/08/21 1456 90     Resp 02/08/21 1456 16     Temp 02/08/21 1456 98.4 F (36.9 C)     Temp Source 02/08/21 1456 Oral     SpO2 02/08/21 1456 100 %     Weight 02/08/21 1454 160 lb 15 oz (73 kg)     Height 02/08/21 1454 5\' 1"  (1.549 m)     Head Circumference --      Peak Flow --      Pain Score 02/08/21 1454 0     Pain Loc --      Pain Edu? --      Excl. in GC? --    Constitutional: Alert and oriented. Well appearing and in no acute distress. Eyes: Conjunctivae are normal. PERRL. Head: Atraumatic. Nose: No congestion/rhinnorhea. Mouth/Throat: Mucous membranes are moist. Neck: No stridor Cardiovascular: Grossly normal heart sounds.  Good peripheral circulation. Respiratory: Normal respiratory effort.  No retractions. Gastrointestinal: Soft and mild suprapubic abdominal tenderness to palpation. No distention. Genitourinary: Pelvic exam deferred due to patient preference Musculoskeletal: No obvious deformities Neurologic:  Normal speech and language. No gross focal neurologic deficits are appreciated.  Skin:  Skin is warm and dry. No rash noted. Psychiatric: Mood and affect are normal. Speech and behavior are normal.  ____________________________________________   LABS (all labs ordered are listed, but only abnormal results are displayed)  Labs Reviewed  CBC WITH DIFFERENTIAL/PLATELET - Abnormal; Notable for the following components:      Result Value   WBC 13.5 (*)    Neutro Abs  9.4 (*)    All other components within normal limits  HCG, QUANTITATIVE, PREGNANCY - Abnormal; Notable for the following components:   hCG, Beta Chain, Quant, S 29 (*)    All other components within normal limits  BASIC METABOLIC PANEL  POC URINE PREG, ED  ABO/RH    PROCEDURES  Procedure(s) performed (including Critical Care):  .1-3 Lead EKG Interpretation Performed by: Merwyn Katos, MD Authorized by: Merwyn Katos, MD     Interpretation: normal     ECG rate:  89   ECG rate assessment: normal     Rhythm: sinus rhythm     Ectopy: none     Conduction: normal       ____________________________________________   INITIAL IMPRESSION / ASSESSMENT AND PLAN / ED COURSE  As part of my medical decision making, I reviewed the following data within the electronic MEDICAL RECORD NUMBER Nursing notes reviewed and incorporated, Labs reviewed, EKG interpreted, Old chart reviewed, Radiograph reviewed and Notes from prior ED visits reviewed and incorporated     Workup: CBC, BMP, UA, bHCG, Type&Screen, 1st Trimester Ultrasound(refused)  Based on History, Exam, and ED Workup patient's presentation not consistent with ectopic pregnancy, molar pregnancy, life-threatening coagulopathy, trauma, serious bacterial infection, central process or other emergency.  Disposition: Will discharge home with return precautions and instruction for prompt OBGYN follow up.      ____________________________________________   FINAL CLINICAL IMPRESSION(S) / ED DIAGNOSES  Final diagnoses:  Vaginal bleeding  Spontaneous miscarriage     ED Discharge Orders    None       Note:  This document was prepared using Dragon voice recognition software and may include unintentional dictation errors.   Merwyn Katos, MD 02/09/21 2019

## 2021-02-11 ENCOUNTER — Other Ambulatory Visit: Payer: Self-pay

## 2021-02-11 ENCOUNTER — Ambulatory Visit (INDEPENDENT_AMBULATORY_CARE_PROVIDER_SITE_OTHER): Payer: Medicaid Other

## 2021-02-11 ENCOUNTER — Ambulatory Visit
Admission: EM | Admit: 2021-02-11 | Discharge: 2021-02-11 | Disposition: A | Discharge status: Home / Self Care | Payer: Medicaid Other | Attending: Family Medicine | Admitting: Family Medicine

## 2021-02-11 DIAGNOSIS — R062 Wheezing: Secondary | ICD-10-CM

## 2021-02-11 DIAGNOSIS — R0602 Shortness of breath: Secondary | ICD-10-CM | POA: Diagnosis not present

## 2021-02-11 DIAGNOSIS — R059 Cough, unspecified: Secondary | ICD-10-CM

## 2021-02-11 DIAGNOSIS — R509 Fever, unspecified: Secondary | ICD-10-CM

## 2021-02-11 MED ORDER — PREDNISONE 20 MG PO TABS
40.0000 mg | ORAL_TABLET | Freq: Every day | ORAL | 0 refills | Status: DC
Start: 2021-02-11 — End: 2021-02-17

## 2021-02-11 MED ORDER — AZITHROMYCIN 250 MG PO TABS: 250.0000 mg | ORAL_TABLET | Freq: Every day | ORAL | 0 refills | Status: DC

## 2021-02-11 NOTE — ED Triage Notes (Signed)
Pt c/o productive cough with green sputum, fever, runny nose, chest congestion, and SOB x3 days. States had a neg COVID.

## 2021-02-16 NOTE — ED Provider Notes (Signed)
[facility]   [id number] 02/11/21 Arrival Time: 1854  ASSESSMENT & PLAN:  1. Cough   2. Wheezing    She feels symptoms present several weeks but worse over past several days. I have personally viewed the imaging studies ordered this visit. [facility] early infiltrate on R.  Begin: Meds ordered this encounter  Medications  . predniSONE (DELTASONE) 20 MG tablet    Sig: Take 2 tablets (40 mg total) by mouth daily.    Dispense:  10 tablet    Refill:  0  . azithromycin (ZITHROMAX) 250 MG tablet    Sig: Take 1 tablet (250 mg total) by mouth daily. Take first 2 tablets together, then 1 every day until finished.    Dispense:  6 tablet    Refill:  0     Follow-up Information    [facility] .   Specialty: Urgent Care Why: As needed. Contact information: [address] [id number] mc [place] [address] [phone]              Reviewed expectations re: course of current medical issues. Questions answered. Outlined signs and symptoms indicating need for more acute intervention. Understanding verbalized. After Visit Summary given.   SUBJECTIVE: History from: patient. Sandra Khan is a 30 y.o. female who reports prod cough, subj fever, runny nose, congestion. Few weeks she feels; worse over several days; does feel SOB at times. Questions wheezing. Reports neg COVID test recently.   OBJECTIVE:  Vitals:   02/11/21 1904  BP: 124/85  Pulse: (!) 115  Resp: 20  Temp: 100.1 F (37.8 C)  TempSrc: Oral  SpO2: 94%    Tachycardia noted.  General appearance: alert; no distress Eyes: PERRLA; EOMI; conjunctiva normal HENT: [place]; AT; with nasal congestion Neck: supple  Lungs: speaks full sentences without difficulty; unlabored; bilater exp wheezes Extremities: no edema Skin: warm and dry Neurologic: normal gait Psychological: alert and cooperative; normal mood and affect  Imaging: DG Chest 2  View  Result Date: 02/11/2021 CLINICAL DATA:  Productive cough with green sputum, fever, shortness of breath EXAM: CHEST - 2 VIEW COMPARISON:  Radiograph 11/18/2016 FINDINGS: Some mild airways thickening with patchy opacity present the right infrahilar lung. Remaining portions the lungs are clear. No pneumothorax or visible effusion. The cardiomediastinal contours are unremarkable. No other acute osseous or soft tissue abnormality. IMPRESSION: Mild airways thickening with patchy opacity present the right infrahilar lung, suspicious for bronchopneumonia in the setting of productive cough and fever. Electronically Signed   By: Kreg Shropshire M.D.   On: 02/11/2021 19:43    Allergies  Allergen Reactions  . Mucinex Sinus-Max Cng-Pr-Pn-Cg [Oxymetazoline & Pe-Apap-Gg-Dm] Hives    Past Medical History:  Diagnosis Date  . Anxiety   . HPV in female    Social History   Socioeconomic History  . Marital status: Single    Spouse name: Not on file  . Number of children: Not on file  . Years of education: Not on file  . Highest education level: Not on file  Occupational History  . Not on file  Tobacco Use  . Smoking status: Current Every Day Smoker  . Smokeless tobacco: Never Used  Vaping Use  . Vaping Use: Never used  Substance and Sexual Activity  . Alcohol use: No  . Drug use: No  . Sexual activity: Yes  Other Topics Concern  . Not on file  Social History Narrative  . Not on file   Social  Determinants of Health   Financial Resource Strain: Not on file  Food Insecurity: Not on file  Transportation Needs: Not on file  Physical Activity: Not on file  Stress: Not on file  Social Connections: Not on file  Intimate Partner Violence: Not on file   History reviewed. No pertinent family history. Past Surgical History:  Procedure Laterality Date  . WISDOM TOOTH EXTRACTION       Mardella Layman, MD 02/16/21 1020

## 2021-02-17 ENCOUNTER — Other Ambulatory Visit: Payer: Self-pay

## 2021-02-17 ENCOUNTER — Ambulatory Visit
Admission: EM | Admit: 2021-02-17 | Discharge: 2021-02-17 | Disposition: A | Discharge status: Home / Self Care | Payer: Medicaid Other | Attending: Emergency Medicine | Admitting: Emergency Medicine

## 2021-02-17 DIAGNOSIS — J22 Unspecified acute lower respiratory infection: Secondary | ICD-10-CM | POA: Diagnosis not present

## 2021-02-17 MED ORDER — HYDROCODONE-HOMATROPINE 5-1.5 MG/5ML PO SYRP: 5.0000 mL | ORAL_SOLUTION | Freq: Every evening | ORAL | 0 refills | Status: DC | PRN

## 2021-02-17 MED ORDER — AMOXICILLIN-POT CLAVULANATE 875-125 MG PO TABS: 1.0000 | ORAL_TABLET | Freq: Two times a day (BID) | ORAL | 0 refills | Status: DC

## 2021-02-17 MED ORDER — PREDNISONE 10 MG PO TABS: ORAL_TABLET | ORAL | 0 refills | Status: DC

## 2021-02-17 MED ORDER — BENZONATATE 200 MG PO CAPS: 200.0000 mg | ORAL_CAPSULE | Freq: Three times a day (TID) | ORAL | 0 refills | Status: AC | PRN

## 2021-02-17 MED ORDER — DEXAMETHASONE 10 MG/ML FOR PEDIATRIC ORAL USE
10.0000 mg | Freq: Once | INTRAMUSCULAR | Status: AC
  Administered 2021-02-17: 10 mg via ORAL

## 2021-02-17 NOTE — ED Provider Notes (Signed)
[facility]    CSN: [id number] Arrival date & time: 02/17/21  1248      History   Chief Complaint Chief Complaint  Patient presents with  . Cough  . Nasal Congestion    HPI Sandra Khan is a 30 y.o. female presenting today for evaluation of URI symptoms.  Reports cough and congestion times weeks.  Was seen here approximately 1 week ago and started on prednisone and azithromycin.  Finished course, but reports shortness of breath and chest pain especially with coughing.  Chest x-ray at prior visit showing bronchopneumonia.  Has albuterol inhaler at home which she has been using without relief.  HPI  Past Medical History:  Diagnosis Date  . Anxiety   . HPV in female     There are no problems to display for this patient.   Past Surgical History:  Procedure Laterality Date  . WISDOM TOOTH EXTRACTION      OB History    Gravida  4   Para  1   Term  1   Preterm      AB  1   Living  1     SAB  1   IAB      Ectopic      Multiple      Live Births               Home Medications    Prior to Admission medications   Medication Sig Start Date End Date Taking? Authorizing Provider  amoxicillin-clavulanate (AUGMENTIN) 875-125 MG tablet Take 1 tablet by mouth every 12 (twelve) hours. 02/17/21  Yes Kilo Eshelman C, PA-C  benzonatate (TESSALON) 200 MG capsule Take 1 capsule (200 mg total) by mouth 3 (three) times daily as needed for up to 7 days for cough. 02/17/21 02/24/21 Yes Deago Burruss C, PA-C  HYDROcodone-homatropine (HYCODAN) 5-1.5 MG/5ML syrup Take 5 mLs by mouth at bedtime as needed for cough. 02/17/21  Yes Tyiesha Brackney C, PA-C  predniSONE (DELTASONE) 10 MG tablet Begin with 6 tabs on day 1, 5 tab on day 2, 4 tab on day 3, 3 tab on day 4, 2 tab on day 5, 1 tab on day 6-take with food 02/17/21  Yes Nasya Vincent, [place] C, PA-C    Family History Family History  Problem Relation Age of Onset  . Lupus Mother   . COPD Mother   . Asthma Mother    . Cancer Father     Social History Social History   Tobacco Use  . Smoking status: Current Every Day Smoker  . Smokeless tobacco: Never Used  Vaping Use  . Vaping Use: Never used  Substance Use Topics  . Alcohol use: No  . Drug use: No     Allergies   Mucinex sinus-max cng-pr-pn-cg [oxymetazoline & pe-apap-gg-dm]   Review of Systems Review of Systems  Constitutional: Negative for activity change, appetite change, chills, fatigue and fever.  HENT: Positive for congestion and rhinorrhea. Negative for ear pain, sinus pressure, sore throat and trouble swallowing.   Eyes: Negative for discharge and redness.  Respiratory: Positive for cough, shortness of breath and wheezing. Negative for chest tightness.   Cardiovascular: Negative for chest pain.  Gastrointestinal: Negative for abdominal pain, diarrhea, nausea and vomiting.  Musculoskeletal: Negative for myalgias.  Skin: Negative for rash.  Neurological: Negative for dizziness, light-headedness and headaches.     Physical Exam Triage Vital Signs ED Triage Vitals [02/17/21 1334]  Enc Vitals Group     BP  Pulse      Resp      Temp      Temp src      SpO2      Weight      Height      Head Circumference      Peak Flow      Pain Score 0     Pain Loc      Pain Edu?      Excl. in GC?    No data found.  Updated Vital Signs BP 115/81   Pulse 95   Temp 97.6 F (36.4 C) (Oral)   Resp 20   Ht 5\' 1"  (1.549 m)   Wt 175 lb (79.4 kg)   LMP 02/08/2021 Comment: recent miscarriage  SpO2 95%   BMI 33.07 kg/m   Visual Acuity Right Eye Distance:   Left Eye Distance:   Bilateral Distance:    Right Eye Near:   Left Eye Near:    Bilateral Near:     Physical Exam Vitals and nursing note reviewed.  Constitutional:      Appearance: She is well-developed.     Comments: No acute distress  HENT:     Head: Normocephalic and atraumatic.     Ears:     Comments: Bilateral ears without tenderness to palpation of  external auricle, tragus and mastoid, EAC's without erythema or swelling, TM's with good bony landmarks and cone of light. Non erythematous.     Nose: Nose normal.     Mouth/Throat:     Comments: Oral mucosa pink and moist, no tonsillar enlargement or exudate. Posterior pharynx patent and nonerythematous, no uvula deviation or swelling. Normal phonation. Eyes:     Conjunctiva/sclera: Conjunctivae normal.  Cardiovascular:     Rate and Rhythm: Normal rate and regular rhythm.  Pulmonary:     Effort: Pulmonary effort is normal. No respiratory distress.     Comments: Breathing comfortably at rest, inspiratory wheezing noted throughout bilateral upper lung fields Abdominal:     General: There is no distension.  Musculoskeletal:        General: Normal range of motion.     Cervical back: Neck supple.  Skin:    General: Skin is warm and dry.  Neurological:     Mental Status: She is alert and oriented to person, place, and time.      UC Treatments / Results  Labs (all labs ordered are listed, but only abnormal results are displayed) Labs Reviewed - No data to display  EKG   Radiology No results found.  Procedures Procedures (including critical care time)  Medications Ordered in UC Medications  dexamethasone (DECADRON) 10 MG/ML injection for Pediatric ORAL use 10 mg (10 mg Oral Given 02/17/21 1441)    Initial Impression / Assessment and Plan / UC Course  I have reviewed the triage vital signs and the nursing notes.  Pertinent labs & imaging results that were available during my care of the patient were reviewed by me and considered in my medical decision making (see chart for details).     Continued cough and wheezing suggestive of bronchitis, given prior x-ray suggestive of possible bronchopneumonia we will go ahead and cover with Augmentin, recently completed course of azithromycin, continue inhaler, providing Decadron prior to discharge and putting on 6-day taper of  prednisone, cough syrup at bedtime and Tessalon during the day.  Continue to monitor,  Discussed strict return precautions. Patient verbalized understanding and is agreeable with plan.  Final Clinical Impressions(s) /  UC Diagnoses   Final diagnoses:  Lower respiratory infection (e.g., bronchitis, pneumonia, pneumonitis, pulmonitis)     Discharge Instructions     Begin Augmentin twice daily for 1 week Continue albuterol inhaler 1 to 2 puffs every 4-6 hours for shortness of breath chest tightness and wheezing Prednisone 6-day taper Tessalon/benzonatate every 8 hours for cough during the day Hycodan for nighttime cough-will cause drowsiness Follow-up if not improving or worsening    ED Prescriptions    Medication Sig Dispense Auth. Provider   predniSONE (DELTASONE) 10 MG tablet Begin with 6 tabs on day 1, 5 tab on day 2, 4 tab on day 3, 3 tab on day 4, 2 tab on day 5, 1 tab on day 6-take with food 21 tablet Lakely Elmendorf C, PA-C   amoxicillin-clavulanate (AUGMENTIN) 875-125 MG tablet Take 1 tablet by mouth every 12 (twelve) hours. 14 tablet Eino Whitner C, PA-C   benzonatate (TESSALON) 200 MG capsule Take 1 capsule (200 mg total) by mouth 3 (three) times daily as needed for up to 7 days for cough. 28 capsule Chere Babson C, PA-C   HYDROcodone-homatropine (HYCODAN) 5-1.5 MG/5ML syrup Take 5 mLs by mouth at bedtime as needed for cough. 75 mL Jarod Bozzo, [place] C, PA-C     I have reviewed the PDMP during this encounter.   Lew Dawes, PA-C 02/17/21 1459

## 2021-02-17 NOTE — Discharge Instructions (Addendum)
Begin Augmentin twice daily for 1 week Continue albuterol inhaler 1 to 2 puffs every 4-6 hours for shortness of breath chest tightness and wheezing Prednisone 6-day taper Tessalon/benzonatate every 8 hours for cough during the day Hycodan for nighttime cough-will cause drowsiness Follow-up if not improving or worsening

## 2021-02-17 NOTE — ED Triage Notes (Addendum)
Pt presents to Urgent Care with c/o continued, worsening cough since last visit on 02/11/21. She has finished course of antibiotics and prednisone. Reports sob and cp, particularly when coughing.

## 2024-07-31 ENCOUNTER — Encounter: Payer: Self-pay | Admitting: Obstetrics and Gynecology

## 2024-07-31 ENCOUNTER — Ambulatory Visit (INDEPENDENT_AMBULATORY_CARE_PROVIDER_SITE_OTHER): Payer: Self-pay | Admitting: Obstetrics and Gynecology

## 2024-07-31 ENCOUNTER — Other Ambulatory Visit (HOSPITAL_COMMUNITY)
Admission: RE | Admit: 2024-07-31 | Discharge: 2024-07-31 | Disposition: A | Discharge status: Home / Self Care | Source: Ambulatory Visit | Attending: Obstetrics and Gynecology | Admitting: Obstetrics and Gynecology

## 2024-07-31 VITALS — BP 124/84 | HR 88 | Wt 175.0 lb

## 2024-07-31 DIAGNOSIS — Z31430 Encounter of female for testing for genetic disease carrier status for procreative management: Secondary | ICD-10-CM

## 2024-07-31 DIAGNOSIS — F419 Anxiety disorder, unspecified: Secondary | ICD-10-CM | POA: Insufficient documentation

## 2024-07-31 DIAGNOSIS — O0990 Supervision of high risk pregnancy, unspecified, unspecified trimester: Secondary | ICD-10-CM | POA: Insufficient documentation

## 2024-07-31 DIAGNOSIS — Z6830 Body mass index (BMI) 30.0-30.9, adult: Secondary | ICD-10-CM

## 2024-07-31 DIAGNOSIS — Z3481 Encounter for supervision of other normal pregnancy, first trimester: Secondary | ICD-10-CM

## 2024-07-31 DIAGNOSIS — Z3A10 10 weeks gestation of pregnancy: Secondary | ICD-10-CM | POA: Diagnosis not present

## 2024-07-31 DIAGNOSIS — O99341 Other mental disorders complicating pregnancy, first trimester: Secondary | ICD-10-CM

## 2024-07-31 DIAGNOSIS — O219 Vomiting of pregnancy, unspecified: Secondary | ICD-10-CM | POA: Diagnosis not present

## 2024-07-31 DIAGNOSIS — N888 Other specified noninflammatory disorders of cervix uteri: Secondary | ICD-10-CM

## 2024-07-31 DIAGNOSIS — Z8719 Personal history of other diseases of the digestive system: Secondary | ICD-10-CM

## 2024-07-31 DIAGNOSIS — O99211 Obesity complicating pregnancy, first trimester: Secondary | ICD-10-CM | POA: Diagnosis not present

## 2024-07-31 DIAGNOSIS — O99210 Obesity complicating pregnancy, unspecified trimester: Secondary | ICD-10-CM | POA: Insufficient documentation

## 2024-07-31 MED ORDER — ESCITALOPRAM OXALATE 10 MG PO TABS: 10.0000 mg | ORAL_TABLET | Freq: Every day | ORAL | 3 refills | Status: AC

## 2024-07-31 MED ORDER — PROMETHAZINE HCL 25 MG PO TABS: 25.0000 mg | ORAL_TABLET | Freq: Four times a day (QID) | ORAL | 1 refills | Status: AC | PRN

## 2024-07-31 NOTE — Progress Notes (Signed)
 New OB Note  07/31/2024   Clinic: [facility]  Chief Complaint: new OB  Transfer of Care Patient: no  History of Present Illness: Sandra Khan is a 33 y.o. [id number] at 10/3 weeks (EDC 4/12, based on 8wk u/s at a Pregnancy Center. Patient's last menstrual period was 05/10/2024., but periods can range from every 30-45 days  Pregnancy complicated by has History of biliary colic and Anxiety and depression on their problem list.   Nausea of pregnancy and phenergan  has helped in the past  Patient stopped lexapro  when she found out she was pregnant, >1 month ago, and she feels like she needs to restart  ROS: A 12-point review of systems was performed and negative, except as stated in the above HPI.  OBGYN History: As per HPI. OB History  Gravida Para Term Preterm AB Living  6 2 2  3 2   SAB IAB Ectopic Multiple Live Births  3    2    # Outcome Date GA Lbr Len/2nd Weight Sex Type Anes PTL Lv  6 Current           5 SAB 2022          4 SAB 2020          3 Term 07/31/17    F Vag-Spont   LIV  2 Term 08/20/12    F Vag-Spont   LIV  1 SAB 2011            Obstetric Comments  6.5lbs for both    Any issues with any prior pregnancies: no Prior children are healthy, doing well, and without any problems or issues: yes   Past Medical History: Past Medical History:  Diagnosis Date   Anxiety    Gallstones    Heart murmur    HPV in female     Past Surgical History: Past Surgical History:  Procedure Laterality Date   WISDOM TOOTH EXTRACTION      Family History:  Family History  Problem Relation Age of Onset   Lupus Mother    COPD Mother    Asthma Mother    Cancer Father    Social History:  Social History   Socioeconomic History   Marital status: Single    Spouse name: Not on file   Number of children: Not on file   Years of education: Not on file   Highest education level: Not on file  Occupational History   Not on file  Tobacco Use   Smoking  status: Every Day   Smokeless tobacco: Never  Vaping Use   Vaping status: Never Used  Substance and Sexual Activity   Alcohol use: No   Drug use: No   Sexual activity: Yes  Other Topics Concern   Not on file  Social History Narrative   Not on file   Social Drivers of Health   Financial Resource Strain: Medium Risk (12/19/2023)   Received from [facility]    Overall Financial Resource Strain (CARDIA)    Difficulty of Paying Living Expenses: Somewhat hard  Food Insecurity: Food Insecurity Present (12/19/2023)   Received from [facility]    Hunger Vital Sign    Within the past 12 months, you worried that your food would run out before you got the money to buy more.: Often true    Within the past 12 months, the food you bought just didn't last and you didn't have money to get more.: Often true  Transportation Needs: No Transportation Needs (12/19/2023)   Received from [facility]    [employer] - Administrator, Civil Service (Medical): No    Lack of Transportation (Non-Medical): No  Physical Activity: Sufficiently Active (12/19/2023)   Received from [facility]    Exercise Vital Sign    On average, how many days per week do you engage in moderate to strenuous exercise (like a brisk walk)?: 4 days    On average, how many minutes do you engage in exercise at this level?: 150+ min  Stress: Not on file (12/19/2023)  Recent Concern: Stress - Stress Concern Present (12/19/2023)   Received from [facility]    [employer] of Occupational Health - Occupational Stress Questionnaire    Feeling of Stress : Rather much  Social Connections: Moderately Isolated (12/19/2023)   Received from [facility]    Social Connection and Isolation Panel    In a typical week, how many times do you talk on the phone with family, friends, or neighbors?: More than  three times a week    How often do you get together with friends or relatives?: More than three times a week    How often do you attend church or religious services?: More than 4 times per year    Do you belong to any clubs or organizations such as church groups, unions, fraternal or athletic groups, or school groups?: No    How often do you attend meetings of the clubs or organizations you belong to?: Never    Are you married, widowed, divorced, separated, never married, or living with a partner?: Divorced  Intimate Partner Violence: Not At Risk (12/19/2023)   Received from [facility]    Humiliation, Afraid, Rape, and Kick questionnaire    Within the last year, have you been afraid of your partner or ex-partner?: No    Within the last year, have you been humiliated or emotionally abused in other ways by your partner or ex-partner?: No    Within the last year, have you been kicked, hit, slapped, or otherwise physically hurt by your partner or ex-partner?: No    Within the last year, have you been raped or forced to have any kind of sexual activity by your partner or ex-partner?: No    Allergy: Allergies  Allergen Reactions   Mucinex Sinus-Max Cng-Pr-Pn-Cg [Oxymetazoline & Pe-Apap-Gg-Dm] Hives    Current Outpatient Medications: Prenatal  Physical Exam:   BP 124/84   Pulse 88   Wt 175 lb (79.4 kg)   LMP 05/10/2024   BMI 33.07 kg/m  Body mass index is 33.07 kg/m. Contractions: Not present Vag. Bleeding: None. Fundal height: not applicable FHTs: 170s  CMA chaperone for entire exam General appearance: Well nourished, well developed female in no acute distress.  Neck:  Supple, normal appearance, and no thyromegaly  Cardiovascular: S1, S2 normal, no murmur, rub or gallop, regular rate and rhythm Respiratory:  Clear to auscultation bilateral. Normal respiratory effort Abdomen: positive bowel sounds and no masses, hernias; diffusely non tender to palpation,  non distended Breasts: breasts appear normal, no suspicious masses, no skin or nipple changes or axillary nodes, b/l piercings. Neuro/Psych:  Normal mood and affect.  Skin:  Warm and dry.  Lymphatic:  No inguinal lymphadenopathy.   Pelvic exam: is not limited by body habitus EGBUS: within normal limits, Vagina: within normal limits and with no blood in the vault, Cervix: normal appearing  cervix without discharge or lesions, closed/long/high, Uterus:  enlarged, c/w 10-12 week size, and Adnexa:  normal adnexa and no mass, fullness, tenderness  Laboratory: none  Imaging:  Patient showed picture of early u/s at pregnancy center  Assessment: patient doing well  Plan: 1. Anxiety and depression (Primary) Pt on 10 of lexapro  and I told her that I recommend restarting given patient feels anxiety is increasing  2. Supervision of high risk pregnancy, antepartum - POCT urine pregnancy - CBC/D/Plt+RPR+Rh+ABO+RubIgG... - Cytology - PAP( [place]) - Hemoglobin A1c - TSH Rfx on Abnormal to Free T4 - Comprehensive metabolic panel with GFR - Protein / creatinine ratio, urine - US  MFM OB DETAIL +14 WK; Future  3. Nausea and vomiting during pregnancy prior to [redacted] weeks gestation Phenergan  sent in  4. BMI 30.0-30.9,adult - Hemoglobin A1c - TSH Rfx on Abnormal to Free T4 - Comprehensive metabolic panel with GFR - Protein / creatinine ratio, urine - US  MFM OB DETAIL +14 WK; Future  5. Obesity in pregnancy - Hemoglobin A1c - TSH Rfx on Abnormal to Free T4 - Comprehensive metabolic panel with GFR - Protein / creatinine ratio, urine - US  MFM OB DETAIL +14 WK; Future  6. Encounter for supervision of other normal pregnancy in first trimester - PANORAMA PRENATAL TEST  7. Encounter of female for testing for genetic disease carrier status for procreative management - [facility] Basic Panel  8. Friable cervix With pap today.   9. History of biliary colic Low fat diet and ED precautions  given.   Problem list reviewed and updated.  Follow up in 4 weeks.  >50% of 30 min visit spent on counseling and coordination of care.  Return in about 1 month (around 08/30/2024) for in person, low risk ob, md or app.   Bebe Izell Overcast MD Attending [facility])

## 2024-07-31 NOTE — Addendum Note (Signed)
 Addended by: JERRYE AREA D on: 07/31/2024 11:01 AM   Modules accepted: Orders

## 2024-07-31 NOTE — Progress Notes (Signed)
 Pt states she had u/s at a mobile site[facility], and showed dates 1 week difference.  Pt has copy of report on phone.  Pt does complain of N&V.  Pt states she has anxiety, has been on Lexapro  in the past, may want Rx today. Pt also states she has a heart murmur and gallstones.

## 2024-08-01 ENCOUNTER — Inpatient Hospital Stay (HOSPITAL_COMMUNITY)
Admission: AD | Admit: 2024-08-01 | Discharge: 2024-08-01 | Disposition: A | Discharge status: Home / Self Care | Attending: Family Medicine | Admitting: Family Medicine

## 2024-08-01 ENCOUNTER — Other Ambulatory Visit: Payer: Self-pay

## 2024-08-01 ENCOUNTER — Encounter (HOSPITAL_COMMUNITY): Payer: Self-pay | Admitting: Obstetrics & Gynecology

## 2024-08-01 DIAGNOSIS — O3441 Maternal care for other abnormalities of cervix, first trimester: Secondary | ICD-10-CM | POA: Diagnosis not present

## 2024-08-01 DIAGNOSIS — O2621 Pregnancy care for patient with recurrent pregnancy loss, first trimester: Secondary | ICD-10-CM | POA: Insufficient documentation

## 2024-08-01 DIAGNOSIS — O209 Hemorrhage in early pregnancy, unspecified: Secondary | ICD-10-CM | POA: Diagnosis present

## 2024-08-01 DIAGNOSIS — O09291 Supervision of pregnancy with other poor reproductive or obstetric history, first trimester: Secondary | ICD-10-CM | POA: Insufficient documentation

## 2024-08-01 DIAGNOSIS — R109 Unspecified abdominal pain: Secondary | ICD-10-CM | POA: Diagnosis present

## 2024-08-01 DIAGNOSIS — Z3A10 10 weeks gestation of pregnancy: Secondary | ICD-10-CM | POA: Diagnosis not present

## 2024-08-01 LAB — CBC/D/PLT+RPR+RH+ABO+RUBIGG...
Antibody Screen: NEGATIVE
Basophils Absolute: 0 x10E3/uL (ref 0.0–0.2)
Basos: 0 %
EOS (ABSOLUTE): 0.1 x10E3/uL (ref 0.0–0.4)
Eos: 1 %
HCV Ab: NONREACTIVE
HIV Screen 4th Generation wRfx: NONREACTIVE
Hematocrit: 37.2 % (ref 34.0–46.6)
Hemoglobin: 12.2 g/dL (ref 11.1–15.9)
Hepatitis B Surface Ag: NEGATIVE
Immature Grans (Abs): 0 x10E3/uL (ref 0.0–0.1)
Immature Granulocytes: 0 %
Lymphocytes Absolute: 2 x10E3/uL (ref 0.7–3.1)
Lymphs: 15 %
MCH: 29.5 pg (ref 26.6–33.0)
MCHC: 32.8 g/dL (ref 31.5–35.7)
MCV: 90 fL (ref 79–97)
Monocytes Absolute: 0.6 x10E3/uL (ref 0.1–0.9)
Monocytes: 4 %
Neutrophils Absolute: 11.1 x10E3/uL — ABNORMAL HIGH (ref 1.4–7.0)
Neutrophils: 80 %
Platelets: 339 x10E3/uL (ref 150–450)
RBC: 4.14 x10E6/uL (ref 3.77–5.28)
RDW: 12.3 % (ref 11.7–15.4)
RPR Ser Ql: NONREACTIVE
Rh Factor: POSITIVE
Rubella Antibodies, IGG: 4.1 {index} (ref >0.99)
WBC: 13.9 x10E3/uL — ABNORMAL HIGH (ref 3.4–10.8)

## 2024-08-01 LAB — HEMOGLOBIN A1C
Est. average glucose Bld gHb Est-mCnc: 108 mg/dL
Hgb A1c MFr Bld: 5.4 % (ref 4.8–5.6)

## 2024-08-01 LAB — WET PREP, GENITAL
Clue Cells Wet Prep HPF POC: NONE SEEN
Sperm: NONE SEEN
Trich, Wet Prep: NONE SEEN
WBC, Wet Prep HPF POC: >=10 — AB (ref <10)
Yeast Wet Prep HPF POC: NONE SEEN

## 2024-08-01 LAB — COMPREHENSIVE METABOLIC PANEL WITH GFR
ALT: 13 IU/L (ref 0–32)
AST: 13 IU/L (ref 0–40)
Albumin: 3.9 g/dL (ref 3.9–4.9)
Alkaline Phosphatase: 77 IU/L (ref 41–116)
BUN/Creatinine Ratio: 17 (ref 9–23)
BUN: 8 mg/dL (ref 6–20)
Bilirubin Total: 0.2 mg/dL (ref 0.0–1.2)
CO2: 22 mmol/L (ref 20–29)
Calcium: 9.3 mg/dL (ref 8.7–10.2)
Chloride: 101 mmol/L (ref 96–106)
Creatinine, Ser: 0.47 mg/dL — ABNORMAL LOW (ref 0.57–1.00)
Globulin, Total: 2.8 g/dL (ref 1.5–4.5)
Glucose: 91 mg/dL (ref 70–99)
Potassium: 4.4 mmol/L (ref 3.5–5.2)
Sodium: 137 mmol/L (ref 134–144)
Total Protein: 6.7 g/dL (ref 6.0–8.5)
eGFR: 130 mL/min/1.73 (ref >59)

## 2024-08-01 LAB — TSH RFX ON ABNORMAL TO FREE T4: TSH: 2.94 u[IU]/mL (ref 0.450–4.500)

## 2024-08-01 LAB — PROTEIN / CREATININE RATIO, URINE
Creatinine, Urine: 112.9 mg/dL
Protein, Ur: 14.3 mg/dL
Protein/Creat Ratio: 127 mg/g{creat} (ref 0–200)

## 2024-08-01 LAB — CYTOLOGY - PAP
Chlamydia: NEGATIVE
Comment: NEGATIVE
Comment: NEGATIVE
Comment: NORMAL
Diagnosis: NEGATIVE
High risk HPV: NEGATIVE
Neisseria Gonorrhea: NEGATIVE

## 2024-08-01 LAB — HCV INTERPRETATION

## 2024-08-01 NOTE — MAU Provider Note (Signed)
 History     CSN: [id number]  Arrival date and time: 08/01/24 1259   None     Chief Complaint  Patient presents with   Abdominal Pain   Vaginal Bleeding   HPI Patient is a 33 year old G6, P2 at 10 weeks 4 days presenting for vaginal bleeding early pregnancy.  She went to her initial OB appointment yesterday and had a Pap smear collected.  She was told that her cervix was very friable and that she may see some bleeding but was not expecting as much bright red blood.  Reports that she just notices the bleeding when she wipes and it has transitioned to a brown color.  Has had multiple miscarriages as well as concerned about another miscarriage or cervical cancer because she has been researching causes of friable cervix.  Denies any other symptoms. OB History     Gravida  6   Para  2   Term  2   Preterm      AB  3   Living  2      SAB  3   IAB      Ectopic      Multiple      Live Births  2        Obstetric Comments  6.5lbs for both          Past Medical History:  Diagnosis Date   Anxiety    Gallstones    Heart murmur    HPV in female     Past Surgical History:  Procedure Laterality Date   MOUTH SURGERY     WISDOM TOOTH EXTRACTION      Family History  Problem Relation Age of Onset   Lupus Mother    COPD Mother    Asthma Mother    Cancer Father     Social History   Tobacco Use   Smoking status: Every Day   Smokeless tobacco: Never  Vaping Use   Vaping status: Never Used  Substance Use Topics   Alcohol use: No   Drug use: No    Allergies:  Allergies  Allergen Reactions   Mucinex Sinus-Max Cng-Pr-Pn-Cg [Oxymetazoline & Pe-Apap-Gg-Dm] Hives    Medications Prior to Admission  Medication Sig Dispense Refill Last Dose/Taking   escitalopram  (LEXAPRO ) 10 MG tablet Take 1 tablet (10 mg total) by mouth daily. 30 tablet 3 07/31/2024   Prenatal Vit-Fe Fumarate-FA (MULTIVITAMIN-PRENATAL) 27-0.8 MG TABS tablet Take 1 tablet by mouth daily at 12  noon.   08/01/2024   promethazine  (PHENERGAN ) 25 MG tablet Take 1 tablet (25 mg total) by mouth every 6 (six) hours as needed for nausea or vomiting. 30 tablet 1     Review of Systems  Gastrointestinal:  Negative for abdominal pain.  Genitourinary:  Positive for vaginal bleeding and vaginal discharge.   Physical Exam   Blood pressure 112/78, pulse 85, temperature 99.1 F (37.3 C), temperature source Oral, resp. rate 20, height 5' 1 (1.549 m), weight 78.2 kg, last menstrual period 05/10/2024, SpO2 98%.  Physical Exam Vitals and nursing note reviewed.  Constitutional:      Appearance: Normal appearance.  HENT:     Head: Normocephalic and atraumatic.     Nose: No congestion or rhinorrhea.  Eyes:     Extraocular Movements: Extraocular movements intact.  Cardiovascular:     Rate and Rhythm: Normal rate.  Pulmonary:     Effort: Pulmonary effort is normal.  Abdominal:     Palpations: Abdomen is soft.  Tenderness: There is no abdominal tenderness.  Genitourinary:    Vagina: Vaginal discharge present.     Cervix: Friability present.     Comments: Brownish discharge noticed at os as well as in the vagina Musculoskeletal:        General: Normal range of motion.     Cervical back: Normal range of motion.  Skin:    General: Skin is warm.     Capillary Refill: Capillary refill takes less than 2 seconds.  Neurological:     General: No focal deficit present.     Mental Status: She is alert.     Cranial Nerves: No cranial nerve deficit.  Psychiatric:        Mood and Affect: Mood normal.        Behavior: Behavior normal.    Pt informed that the ultrasound is considered a limited OB ultrasound and is not intended to be a complete ultrasound exam.  Patient also informed that the ultrasound is not being completed with the intent of assessing for fetal or placental anomalies or any pelvic abnormalities.  Explained that the purpose of today's ultrasound is to assess for  patient  reassurance and viability.  Patient acknowledges the purpose of the exam and the limitations of the study.    MAU Course  Procedures  MDM Pelvic exam Bedside ultrasound  Assessment and Plan  Westlyn Glaza is a 33 yo G6P2 @[redacted]w[redacted]d  presenting for vaginal bleeding in early pregnancy  Vaginal bleeding in early pregnancy Patient with vaginal bleeding since yesterday, started out red after the Pap smear and has become brown.  Cervical friability noted on exam as well as brownish discharge.  Wet prep collected and showing WBCs but otherwise within normal limits.  Bleeding is likely due to the Pap smear and friable cervix.  Discussed this with patient.  Also discussed results from her Pap smear.  No further questions or concerns.  Return precautions given.  Patient discharged home.  Rhys Lichty V Darely Becknell 08/01/2024, 5:31 PM

## 2024-08-01 NOTE — Discharge Instructions (Signed)
 It was a pleasure taking care of you today.  Your bleeding is likely from your friable cervix and the Pap smear.  The exam was very reassuring.  The ultrasound of your baby looked wonderful.  If you have any questions or concerns please return for further evaluation.  I hope you have a wonderful rest of your day!

## 2024-08-01 NOTE — MAU Note (Signed)
 Sandra Khan is a 33 y.o. at [redacted]w[redacted]d here in MAU reporting: she's [redacted] weeks pregnant, had a Pap Smear yesterday and today began having abdominal cramping and bright VB with wiping.  Reports VB is mostly seen after voiding and when wiping, has noted small blood clots.  States Dr. Izell told her she has a friable cervix, concerned because has Hx of previous miscarriage.  LMP: 05/10/2024 Onset of complaint: today Pain score: 3 Vitals:   08/01/24 1341  BP: 112/78  Pulse: 85  Resp: 20  Temp: 99.1 F (37.3 C)  SpO2: 98%     FHT: 173 bpm  Lab orders placed from triage: None

## 2024-08-02 LAB — CULTURE, OB URINE

## 2024-08-02 LAB — URINE CULTURE, OB REFLEX

## 2024-08-03 ENCOUNTER — Ambulatory Visit: Payer: Self-pay | Admitting: Obstetrics and Gynecology

## 2024-08-03 DIAGNOSIS — O0990 Supervision of high risk pregnancy, unspecified, unspecified trimester: Secondary | ICD-10-CM | POA: Insufficient documentation

## 2024-08-09 LAB — PANORAMA PRENATAL TEST FULL PANEL:PANORAMA TEST PLUS 5 ADDITIONAL MICRODELETIONS: FETAL FRACTION: 4.3

## 2024-08-09 LAB — HORIZON CUSTOM: REPORT SUMMARY: NEGATIVE

## 2024-08-13 ENCOUNTER — Encounter (HOSPITAL_COMMUNITY): Payer: Self-pay | Admitting: *Deleted

## 2024-08-13 ENCOUNTER — Emergency Department (HOSPITAL_COMMUNITY)
Admission: EM | Admit: 2024-08-13 | Discharge: 2024-08-14 | Disposition: A | Discharge status: Home / Self Care | Attending: Emergency Medicine | Admitting: Emergency Medicine

## 2024-08-13 ENCOUNTER — Emergency Department (HOSPITAL_COMMUNITY)

## 2024-08-13 ENCOUNTER — Other Ambulatory Visit: Payer: Self-pay

## 2024-08-13 DIAGNOSIS — R0789 Other chest pain: Secondary | ICD-10-CM | POA: Diagnosis not present

## 2024-08-13 DIAGNOSIS — R0602 Shortness of breath: Secondary | ICD-10-CM | POA: Diagnosis not present

## 2024-08-13 DIAGNOSIS — R8271 Bacteriuria: Secondary | ICD-10-CM | POA: Diagnosis not present

## 2024-08-13 DIAGNOSIS — R079 Chest pain, unspecified: Secondary | ICD-10-CM | POA: Diagnosis present

## 2024-08-13 DIAGNOSIS — D72829 Elevated white blood cell count, unspecified: Secondary | ICD-10-CM | POA: Insufficient documentation

## 2024-08-13 DIAGNOSIS — K8020 Calculus of gallbladder without cholecystitis without obstruction: Secondary | ICD-10-CM | POA: Diagnosis not present

## 2024-08-13 LAB — COMPREHENSIVE METABOLIC PANEL WITH GFR
ALT: 13 U/L (ref 0–44)
AST: 16 U/L (ref 15–41)
Albumin: 3.1 g/dL — ABNORMAL LOW (ref 3.5–5.0)
Alkaline Phosphatase: 56 U/L (ref 38–126)
Anion gap: 9 (ref 5–15)
BUN: 5 mg/dL — ABNORMAL LOW (ref 6–20)
CO2: 22 mmol/L (ref 22–32)
Calcium: 9.2 mg/dL (ref 8.9–10.3)
Chloride: 101 mmol/L (ref 98–111)
Creatinine, Ser: 0.46 mg/dL (ref 0.44–1.00)
GFR, Estimated: >60 mL/min (ref >60)
Glucose, Bld: 97 mg/dL (ref 70–99)
Potassium: 4.2 mmol/L (ref 3.5–5.1)
Sodium: 132 mmol/L — ABNORMAL LOW (ref 135–145)
Total Bilirubin: 0.4 mg/dL (ref 0.0–1.2)
Total Protein: 6.9 g/dL (ref 6.5–8.1)

## 2024-08-13 LAB — URINALYSIS, ROUTINE W REFLEX MICROSCOPIC
Bilirubin Urine: NEGATIVE
Glucose, UA: NEGATIVE mg/dL
Hgb urine dipstick: NEGATIVE
Ketones, ur: NEGATIVE mg/dL
Nitrite: NEGATIVE
Protein, ur: 30 mg/dL — AB
Specific Gravity, Urine: 1.023 (ref 1.005–1.030)
pH: 5 (ref 5.0–8.0)

## 2024-08-13 LAB — CBC WITH DIFFERENTIAL/PLATELET
Abs Immature Granulocytes: 0.06 K/uL (ref 0.00–0.07)
Basophils Absolute: 0 K/uL (ref 0.0–0.1)
Basophils Relative: 0 %
Eosinophils Absolute: 0.1 K/uL (ref 0.0–0.5)
Eosinophils Relative: 1 %
HCT: 35.5 % — ABNORMAL LOW (ref 36.0–46.0)
Hemoglobin: 11.8 g/dL — ABNORMAL LOW (ref 12.0–15.0)
Immature Granulocytes: 1 %
Lymphocytes Relative: 16 %
Lymphs Abs: 2.2 K/uL (ref 0.7–4.0)
MCH: 28.8 pg (ref 26.0–34.0)
MCHC: 33.2 g/dL (ref 30.0–36.0)
MCV: 86.6 fL (ref 80.0–100.0)
Monocytes Absolute: 0.7 K/uL (ref 0.1–1.0)
Monocytes Relative: 5 %
Neutro Abs: 10.1 K/uL — ABNORMAL HIGH (ref 1.7–7.7)
Neutrophils Relative %: 77 %
Platelets: 314 K/uL (ref 150–400)
RBC: 4.1 MIL/uL (ref 3.87–5.11)
RDW: 12.3 % (ref 11.5–15.5)
WBC: 13.1 K/uL — ABNORMAL HIGH (ref 4.0–10.5)
nRBC: 0 % (ref 0.0–0.2)

## 2024-08-13 LAB — I-STAT CHEM 8, ED
BUN: 5 mg/dL — ABNORMAL LOW (ref 6–20)
Calcium, Ion: 1.17 mmol/L (ref 1.15–1.40)
Chloride: 103 mmol/L (ref 98–111)
Creatinine, Ser: 0.4 mg/dL — ABNORMAL LOW (ref 0.44–1.00)
Glucose, Bld: 95 mg/dL (ref 70–99)
HCT: 35 % — ABNORMAL LOW (ref 36.0–46.0)
Hemoglobin: 11.9 g/dL — ABNORMAL LOW (ref 12.0–15.0)
Potassium: 4.4 mmol/L (ref 3.5–5.1)
Sodium: 135 mmol/L (ref 135–145)
TCO2: 23 mmol/L (ref 22–32)

## 2024-08-13 LAB — LIPASE, BLOOD: Lipase: 21 U/L (ref 11–51)

## 2024-08-13 MED ORDER — ONDANSETRON HCL 4 MG/2ML IJ SOLN
4.0000 mg | Freq: Once | INTRAMUSCULAR | Status: DC
  Filled 2024-08-13: qty 2

## 2024-08-13 MED ORDER — LACTATED RINGERS IV BOLUS: 1000.0000 mL | Freq: Once | INTRAVENOUS | Status: DC

## 2024-08-13 MED ORDER — METOCLOPRAMIDE HCL 10 MG PO TABS: 10.0000 mg | ORAL_TABLET | Freq: Once | ORAL | Status: DC

## 2024-08-13 MED ORDER — SODIUM CHLORIDE 0.9 % IV BOLUS
1000.0000 mL | Freq: Once | INTRAVENOUS | Status: AC
  Administered 2024-08-13: 1000 mL via INTRAVENOUS

## 2024-08-13 NOTE — ED Triage Notes (Signed)
 Pt has chest pain and SOB when laying starting a few days ago. Pt is [redacted] weeks pregnant, 3rd pregnancy with no prior complications. OB sent her to ER due to chest pain.

## 2024-08-13 NOTE — ED Triage Notes (Signed)
 Pt states that she is [redacted] weeks pregnant, she states that she is due 4/12 and has had chest pain and upper abdominal pain which is causing her to have some sob.  This has been going on for a few days, pt has hx of biliary colic and has gallstones

## 2024-08-13 NOTE — ED Provider Notes (Incomplete)
 [place] EMERGENCY DEPARTMENT AT [facility] Provider Note   CSN: [id number] Arrival date & time: 08/13/24  1504     Patient presents with: Chest Pain and Shortness of Breath   Sandra Khan is a 33 y.o. female.  {Add pertinent medical, surgical, social history, OB history to HPI:32947}  Chest Pain Associated symptoms: shortness of breath   Shortness of Breath Associated symptoms: chest pain           Prior to Admission medications   Medication Sig Start Date End Date Taking? Authorizing Provider  escitalopram  (LEXAPRO ) 10 MG tablet Take 1 tablet (10 mg total) by mouth daily. 07/31/24   Izell Harari, MD  Prenatal Vit-Fe Fumarate-FA (MULTIVITAMIN-PRENATAL) 27-0.8 MG TABS tablet Take 1 tablet by mouth daily at 12 noon.    [provider]  promethazine  (PHENERGAN ) 25 MG tablet Take 1 tablet (25 mg total) by mouth every 6 (six) hours as needed for nausea or vomiting. 07/31/24   Izell Harari, MD    Allergies: Mucinex sinus-max cng-pr-pn-cg [oxymetazoline & pe-apap-gg-dm]    Review of Systems  Respiratory:  Positive for shortness of breath.   Cardiovascular:  Positive for chest pain.    Updated Vital Signs BP 114/73 (BP Location: Right Arm)   Pulse 88   Temp 98.5 F (36.9 C) (Oral)   Resp 18   LMP 05/10/2024   SpO2 100%   Physical Exam  (all labs ordered are listed, but only abnormal results are displayed) Labs Reviewed  COMPREHENSIVE METABOLIC PANEL WITH GFR - Abnormal; Notable for the following components:      Result Value   Sodium 132 (*)    BUN 5 (*)    Albumin 3.1 (*)    All other components within normal limits  CBC WITH DIFFERENTIAL/PLATELET - Abnormal; Notable for the following components:   WBC 13.1 (*)    Hemoglobin 11.8 (*)    HCT 35.5 (*)    Neutro Abs 10.1 (*)    All other components within normal limits  URINALYSIS, ROUTINE W REFLEX MICROSCOPIC - Abnormal; Notable for the following components:   APPearance HAZY  (*)    Protein, ur 30 (*)    Leukocytes,Ua MODERATE (*)    Bacteria, UA FEW (*)    All other components within normal limits  I-STAT CHEM 8, ED - Abnormal; Notable for the following components:   BUN 5 (*)    Creatinine, Ser 0.40 (*)    Hemoglobin 11.9 (*)    HCT 35.0 (*)    All other components within normal limits  LIPASE, BLOOD  TROPONIN I (HIGH SENSITIVITY)    EKG: EKG Interpretation Date/Time:  Tuesday August 13 2024 16:04:48 EDT Ventricular Rate:  87 PR Interval:  142 QRS Duration:  68 QT Interval:  334 QTC Calculation: 401 R Axis:   61  Text Interpretation: Normal sinus rhythm Normal ECG When compared with ECG of 09-Aug-2018 21:18, PREVIOUS ECG IS PRESENT No significant change since last tracing Confirmed by Dean Clarity [phone]) on 08/13/2024 9:51:52 PM  Radiology: US  Abdomen Limited RUQ (LIVER/GB) Result Date: 08/13/2024 CLINICAL DATA:  Right upper quadrant abdominal pain. EXAM: ULTRASOUND ABDOMEN LIMITED RIGHT UPPER QUADRANT COMPARISON:  None Available. FINDINGS: Gallbladder: Physiologically distended. Multiple gallstones. Normal gallbladder wall thickness. No pericholecystic fluid. No sonographic Murphy sign noted by sonographer. Common bile duct: Diameter: 2 mm, normal. Liver: No focal lesion identified. Within normal limits in parenchymal echogenicity. Portal vein is patent on color Doppler imaging with normal direction of blood  flow towards the liver. Other: No right upper quadrant ascites. IMPRESSION: 1. Gallstones without sonographic findings of acute cholecystitis. 2. Normal sonographic appearance of the gallbladder and biliary tree. Electronically Signed   By: Andrea Gasman M.D.   On: 08/13/2024 23:17   DG Chest 1 View Result Date: 08/13/2024 CLINICAL DATA:  Dyspnea.  Pregnant patient at [redacted] weeks gestation. EXAM: CHEST  1 VIEW COMPARISON:  02/11/2021 FINDINGS: The cardiomediastinal contours are normal. The lungs are clear. Pulmonary vasculature is normal. No  consolidation, pleural effusion, or pneumothorax. No acute osseous abnormalities are seen. IMPRESSION: No active disease. Electronically Signed   By: Andrea Gasman M.D.   On: 08/13/2024 16:36    {Document cardiac monitor, telemetry assessment procedure when appropriate:32947} Procedures   Medications Ordered in the ED  sodium chloride  0.9 % bolus 1,000 mL (1,000 mLs Intravenous New Bag/Given 08/13/24 2244)    Clinical Course as of 08/13/24 2345  Tue Aug 13, 2024  2216 Started having some CP and SOB a week ago feels like a weight on chest. Feels better when she rolls on side.  Only occurs with laying down.  RUQ pain began today with vomiting and nausea [WF]    Clinical Course User Index [WF] Neldon Hamp RAMAN, PA   {Click here for ABCD2, HEART and other calculators REFRESH Note before signing:1}                              Medical Decision Making Amount and/or Complexity of Data Reviewed Radiology: ordered.   ***  {Document critical care time when appropriate  Document review of labs and clinical decision tools ie CHADS2VASC2, etc  Document your independent review of radiology images and any outside records  Document your discussion with family members, caretakers and with consultants  Document social determinants of health affecting pt's care  Document your decision making why or why not admission, treatments were needed:32947:::1}   Final diagnoses:  None    ED Discharge Orders     None

## 2024-08-13 NOTE — ED Provider Notes (Signed)
 [place] EMERGENCY DEPARTMENT AT [facility] Provider Note   CSN: [id number] Arrival date & time: 08/13/24  1504     Patient presents with: Chest Pain and Shortness of Breath   Sandra Khan is a 33 y.o. female.    Chest Pain Associated symptoms: shortness of breath   Shortness of Breath Associated symptoms: chest pain    Patient is a 33 year old female with past medical history significant for anxiety she presents emergency room today with complaints of some chest pressure and shortness of breath that has been ongoing for approximately 1 week.  She states she feels better when she rolls on her side it only seems to occur when she is laying down today she developed some right upper quadrant abdominal pain and had persistent nausea with an episode of nonbloody nonbilious emesis.  She came to the emergency room for evaluation after checking with her OB/GYN.       Prior to Admission medications   Medication Sig Start Date End Date Taking? Authorizing Provider  nitrofurantoin, macrocrystal-monohydrate, (MACROBID) 100 MG capsule Take 1 capsule (100 mg total) by mouth 2 (two) times daily. 08/14/24  Yes Aniylah Avans S, PA  escitalopram  (LEXAPRO ) 10 MG tablet Take 1 tablet (10 mg total) by mouth daily. 07/31/24   Izell Harari, MD  Prenatal Vit-Fe Fumarate-FA (MULTIVITAMIN-PRENATAL) 27-0.8 MG TABS tablet Take 1 tablet by mouth daily at 12 noon.    [provider]  promethazine  (PHENERGAN ) 25 MG tablet Take 1 tablet (25 mg total) by mouth every 6 (six) hours as needed for nausea or vomiting. 07/31/24   Izell Harari, MD    Allergies: Mucinex sinus-max cng-pr-pn-cg [oxymetazoline & pe-apap-gg-dm]    Review of Systems  Respiratory:  Positive for shortness of breath.   Cardiovascular:  Positive for chest pain.    Updated Vital Signs BP 114/73 (BP Location: Right Arm)   Pulse 88   Temp 98.5 F (36.9 C) (Oral)   Resp 18   LMP 05/10/2024   SpO2 100%    Physical Exam Vitals and nursing note reviewed.  Constitutional:      General: She is not in acute distress. HENT:     Head: Normocephalic and atraumatic.     Nose: Nose normal.  Eyes:     General: No scleral icterus. Cardiovascular:     Rate and Rhythm: Normal rate and regular rhythm.     Pulses: Normal pulses.     Heart sounds: Normal heart sounds.  Pulmonary:     Effort: Pulmonary effort is normal. No respiratory distress.     Breath sounds: No wheezing.  Abdominal:     Palpations: Abdomen is soft.     Tenderness: There is abdominal tenderness in the right upper quadrant and epigastric area.  Musculoskeletal:     Cervical back: Normal range of motion.     Right lower leg: No edema.     Left lower leg: No edema.  Skin:    General: Skin is warm and dry.     Capillary Refill: Capillary refill takes less than 2 seconds.  Neurological:     Mental Status: She is alert. Mental status is at baseline.  Psychiatric:        Mood and Affect: Mood normal.        Behavior: Behavior normal.     (all labs ordered are listed, but only abnormal results are displayed) Labs Reviewed  COMPREHENSIVE METABOLIC PANEL WITH GFR - Abnormal; Notable for the following components:  Result Value   Sodium 132 (*)    BUN 5 (*)    Albumin 3.1 (*)    All other components within normal limits  CBC WITH DIFFERENTIAL/PLATELET - Abnormal; Notable for the following components:   WBC 13.1 (*)    Hemoglobin 11.8 (*)    HCT 35.5 (*)    Neutro Abs 10.1 (*)    All other components within normal limits  URINALYSIS, ROUTINE W REFLEX MICROSCOPIC - Abnormal; Notable for the following components:   APPearance HAZY (*)    Protein, ur 30 (*)    Leukocytes,Ua MODERATE (*)    Bacteria, UA FEW (*)    All other components within normal limits  I-STAT CHEM 8, ED - Abnormal; Notable for the following components:   BUN 5 (*)    Creatinine, Ser 0.40 (*)    Hemoglobin 11.9 (*)    HCT 35.0 (*)    All other  components within normal limits  URINE CULTURE  LIPASE, BLOOD  TROPONIN I (HIGH SENSITIVITY)    EKG: EKG Interpretation Date/Time:  Tuesday August 13 2024 16:04:48 EDT Ventricular Rate:  87 PR Interval:  142 QRS Duration:  68 QT Interval:  334 QTC Calculation: 401 R Axis:   61  Text Interpretation: Normal sinus rhythm Normal ECG When compared with ECG of 09-Aug-2018 21:18, PREVIOUS ECG IS PRESENT No significant change since last tracing Confirmed by Dean Clarity [id number]) on 08/13/2024 9:51:52 PM  Radiology: US  Abdomen Limited RUQ (LIVER/GB) Result Date: 08/13/2024 CLINICAL DATA:  Right upper quadrant abdominal pain. EXAM: ULTRASOUND ABDOMEN LIMITED RIGHT UPPER QUADRANT COMPARISON:  None Available. FINDINGS: Gallbladder: Physiologically distended. Multiple gallstones. Normal gallbladder wall thickness. No pericholecystic fluid. No sonographic Murphy sign noted by sonographer. Common bile duct: Diameter: 2 mm, normal. Liver: No focal lesion identified. Within normal limits in parenchymal echogenicity. Portal vein is patent on color Doppler imaging with normal direction of blood flow towards the liver. Other: No right upper quadrant ascites. IMPRESSION: 1. Gallstones without sonographic findings of acute cholecystitis. 2. Normal sonographic appearance of the gallbladder and biliary tree. Electronically Signed   By: Andrea Gasman M.D.   On: 08/13/2024 23:17   DG Chest 1 View Result Date: 08/13/2024 CLINICAL DATA:  Dyspnea.  Pregnant patient at [redacted] weeks gestation. EXAM: CHEST  1 VIEW COMPARISON:  02/11/2021 FINDINGS: The cardiomediastinal contours are normal. The lungs are clear. Pulmonary vasculature is normal. No consolidation, pleural effusion, or pneumothorax. No acute osseous abnormalities are seen. IMPRESSION: No active disease. Electronically Signed   By: Andrea Gasman M.D.   On: 08/13/2024 16:36     Procedures   Medications Ordered in the ED  sodium chloride  0.9 % bolus  1,000 mL (1,000 mLs Intravenous New Bag/Given 08/13/24 2244)    Clinical Course as of 08/14/24 0026  Tue Aug 13, 2024  2216 Started having some CP and SOB a week ago feels like a weight on chest. Feels better when she rolls on side.  Only occurs with laying down.  RUQ pain began today with vomiting and nausea [WF]    Clinical Course User Index [WF] Neldon Hamp RAMAN, [place]                                 Medical Decision Making Amount and/or Complexity of Data Reviewed Radiology: ordered.   Patient is a 33 year old female with past medical history significant for anxiety she presents emergency room today with  complaints of some chest pressure and shortness of breath that has been ongoing for approximately 1 week.  She states she feels better when she rolls on her side it only seems to occur when she is laying down today she developed some right upper quadrant abdominal pain and had persistent nausea with an episode of nonbloody nonbilious emesis.  She came to the emergency room for evaluation after checking with her OB/GYN.  Patient is currently asymptomatic 33 year old female appearing well on exam has some epigastric right upper quadrant tenderness has gallstones but no evidence of cholecystitis on ultrasound labs with mild leukocytosis that she has been emergency room for 9-1/2 hours she is asymptomatic now will receive IV fluids and is awaiting troponin to see due to some chest pain she has had what she describes as a nonradiating pressure for the past 1 week.  Urinalysis shows asymptomatic bacteriuria she has no urinary symptoms. Urine culture pending will treat with Macrobid  Chest x-ray unremarkable.  Troponin pending.  CMP unremarkable.  I did talk with patient about pulmonary emboli as a cause of her symptoms I have relatively low suspicion for this and offered D-dimer testing, CT PE study, additional workup she declines this at this time.  She is particularly fearful of IV contrast I  did talk to her about the pros and cons of this and my inability to rule out pulmonary emboli without this test.  She feels comfortable with this risk and I have very low suspicion for pulmonary embolism.  Patient care handed off to evening team.  Anticipate discharge home as she is asymptomatic now.  Outpatient follow-up.  Final diagnoses:  Atypical chest pain  Calculus of gallbladder without cholecystitis without obstruction  Asymptomatic bacteriuria    ED Discharge Orders          Ordered    nitrofurantoin, macrocrystal-monohydrate, (MACROBID) 100 MG capsule  2 times daily        08/14/24 0026               Neldon Hamp RAMAN, [place] 08/14/24 9970    Dean Clarity, MD 08/14/24 [phone]

## 2024-08-13 NOTE — ED Provider Triage Note (Signed)
 Emergency Medicine Provider Triage Evaluation Note  Bettie Kerns , a 33 y.o. female  was evaluated in triage.  Pt complains of upper abdominal discomfort, previous hx of biliary colic, has been persistent for one week.  Also has associated dyspnea.  Referred to ED by OB for chest pain.  Review of Systems  Positive: As above Negative:   Physical Exam  BP (!) 135/90 (BP Location: Right Arm)   Pulse 97   Temp 98.6 F (37 C)   Resp 16   LMP 05/10/2024   SpO2 99%  Gen:   Awake, no distress   Resp:  Normal effort  MSK:   Moves extremities without difficulty  Other:    Medical Decision Making  Medically screening exam initiated at 3:50 PM.  Appropriate orders placed.  Bettie Kerns was informed that the remainder of the evaluation will be completed by another provider, this initial triage assessment does not replace that evaluation, and the importance of remaining in the ED until their evaluation is complete.  Appropriate labs/orders placed   Myriam Dorn BROCKS, [place] 08/13/24 1555

## 2024-08-14 ENCOUNTER — Telehealth: Payer: Self-pay

## 2024-08-14 DIAGNOSIS — R0789 Other chest pain: Secondary | ICD-10-CM | POA: Diagnosis not present

## 2024-08-14 LAB — TROPONIN I (HIGH SENSITIVITY): Troponin I (High Sensitivity): 2 ng/L (ref <18)

## 2024-08-14 MED ORDER — NITROFURANTOIN MONOHYD MACRO 100 MG PO CAPS: 100.0000 mg | ORAL_CAPSULE | Freq: Two times a day (BID) | ORAL | 0 refills | Status: DC

## 2024-08-14 NOTE — Telephone Encounter (Signed)
 RTC to pt regarding triage vm  Pt had concerns about new Rx Macrobid and PNV's if ok to take together. I was able to get advice from the provider here in the office pt made aware ok to take both.  Pt agreeable and voiced understanding.

## 2024-08-14 NOTE — ED Provider Notes (Signed)
 Care assumed from [facility], PA-C at shift change.  Please see their note for further information. Physical Exam  BP 114/73 (BP Location: Right Arm)   Pulse 88   Temp 98.5 F (36.9 C) (Oral)   Resp 18   LMP 05/10/2024   SpO2 100%   Physical Exam Vitals and nursing note reviewed.  Constitutional:      General: She is not in acute distress.    Appearance: Normal appearance. She is normal weight. She is not ill-appearing, toxic-appearing or diaphoretic.  HENT:     Head: Normocephalic and atraumatic.  Cardiovascular:     Rate and Rhythm: Normal rate.  Pulmonary:     Effort: Pulmonary effort is normal. No respiratory distress.  Abdominal:     Tenderness: There is no abdominal tenderness. Negative signs include Murphy's sign.  Musculoskeletal:        General: Normal range of motion.     Cervical back: Normal range of motion.  Skin:    General: Skin is warm and dry.  Neurological:     General: No focal deficit present.     Mental Status: She is alert.  Psychiatric:        Mood and Affect: Mood normal.        Behavior: Behavior normal.     Procedures  Procedures  ED Course / MDM   Clinical Course as of 08/14/24 0133  Tue Aug 13, 2024  2216 Started having some CP and SOB a week ago feels like a weight on chest. Feels better when she rolls on side.  Only occurs with laying down.  RUQ pain began today with vomiting and nausea [WF]    Clinical Course User Index [WF] Neldon Hamp RAMAN, [place]   Medical Decision Making Amount and/or Complexity of Data Reviewed Labs: ordered. Radiology: ordered.  Risk Prescription drug management.   Briefly: Patient currently asymptomatic, initially came in for epigastric and RUQ pain with nausea and vomiting.  Abdominal workup was unremarkable, however was sent from OB/GYN for cardiac evaluation and did not initially have troponin ordered.  This was ordered and is pending at shift change.  If this is negative and patient continues to  be asymptomatic, plan for discharge with close outpatient follow-up and return precautions.  Patient's troponin has resulted and is negative.  Discussed results with patient is understanding, she continues to be asymptomatic and has a soft and nontender abdomen.  She feels well to go home per previous providers recommendations. Evaluation and diagnostic testing in the emergency department does not suggest an emergent condition requiring admission or immediate intervention beyond what has been performed at this time.  Plan for discharge with close PCP follow-up.  Patient is understanding and amenable with plan, educated on red flag symptoms that would prompt immediate return.  Patient discharged in stable condition.        Jimena Wieczorek A, PA-C 08/14/24 9862    Haze Lonni PARAS, MD 08/15/24 [phone]

## 2024-08-14 NOTE — Discharge Instructions (Addendum)
 Your cardiac workup has been reassuring today.  I am reassured by your normal workup.  As we discussed, if you develop any hemoptysis/coughing up blood, difficulty breathing becomes worse, you experience any new persistent or worsening chest pain or any other new or concerning symptoms please return the emergency room for reevaluation.  Otherwise please follow-up with your OB/GYN and primary care provider.  As we discussed, conditions such as a blood clot in your lung because rapid heart rate low oxygen levels and chest pain. I am very reassured by your normal vital signs and workup today.   As we discussed, I am also treating you for asymptomatic bacteriuria in other words you have bacteria in your urine but do not have symptoms consistent with urinary tract infection.  Take this antibiotic until the course is complete.  Follow-up with your OB/GYN.

## 2024-08-14 NOTE — ED Notes (Signed)
 Lab called and Urine Culture added

## 2024-08-15 LAB — URINE CULTURE: Culture: 20000 — AB

## 2024-08-16 ENCOUNTER — Telehealth (HOSPITAL_BASED_OUTPATIENT_CLINIC_OR_DEPARTMENT_OTHER): Payer: Self-pay

## 2024-08-16 NOTE — Telephone Encounter (Signed)
 Post ED Visit - Positive Culture Follow-up  Culture report reviewed by antimicrobial stewardship pharmacist: [facility] [x]  Sharyne Glatter, Pharm.D. []  Venetia Gully, Pharm.D., BCPS AQ-ID []  Garrel Crews, Pharm.D., BCPS []  Almarie Lunger, [address].D., BCPS []  [place], [address].D., BCPS, AAHIVP []  Rosaline Bihari, Pharm.D., BCPS, AAHIVP []  Vernell Meier, PharmD, BCPS []  Latanya Hint, PharmD, BCPS []  Donald Medley, PharmD, BCPS []  Rocky Bold, PharmD []  Dorothyann Alert, PharmD, BCPS []  Morene Babe, PharmD  [facility] []  Rosaline Edison, PharmD []  Romona Bliss, PharmD []  Dolphus Roller, PharmD []  Veva Seip, Rph []  Vernell Daunt) Leonce, PharmD []  Eva Allis, PharmD []  Rosaline Millet, PharmD []  Iantha Batch, PharmD []  Arvin Gauss, PharmD []  Wanda Hasting, PharmD []  Ronal Rav, PharmD []  Rocky Slade, PharmD []  Bard Jeans, PharmD   Positive urine culture Treated with Nitrofurantoin Likely Contamination, no treatment need and no further patient follow-up is required at this time.  Sandra Khan 08/16/2024, 9:41 AM

## 2024-08-27 ENCOUNTER — Other Ambulatory Visit: Payer: Self-pay | Admitting: Obstetrics and Gynecology

## 2024-08-28 ENCOUNTER — Encounter: Payer: Self-pay | Admitting: Certified Nurse Midwife

## 2024-08-28 ENCOUNTER — Ambulatory Visit: Admitting: Certified Nurse Midwife

## 2024-08-28 VITALS — BP 126/84 | HR 96 | Wt 174.0 lb

## 2024-08-28 DIAGNOSIS — R4589 Other symptoms and signs involving emotional state: Secondary | ICD-10-CM

## 2024-08-28 DIAGNOSIS — Z3A17 17 weeks gestation of pregnancy: Secondary | ICD-10-CM

## 2024-08-28 DIAGNOSIS — O0990 Supervision of high risk pregnancy, unspecified, unspecified trimester: Secondary | ICD-10-CM | POA: Diagnosis not present

## 2024-08-28 NOTE — Progress Notes (Signed)
 INITIAL PRENATAL VISIT  History:  Sandra Khan is a 33 y.o. [id number] at [redacted]w[redacted]d by early ultrasound being seen today for her first obstetrical visit.  Her obstetrical history is significant for N/A. Patient endorses a hx of gallstones.  Patient does intend to breast feed. Pregnancy history fully reviewed.  Patient reports some SOB with eating and concern for veins becoming more prominent in this pregnancy. Patient also has many concerns for hospital safety, waterbirth, and policy and procedures surrounding worse case scenarios.  .Patient endorses lots of healthcare anxiety in this pregnancy. Denies birth trauma. Patients daughter and mother are present for this visit today and supportive.   HISTORY: OB History  Gravida Para Term Preterm AB Living  6 2 2  0 3 2  SAB IAB Ectopic Multiple Live Births  3 0 0 0 2    # Outcome Date GA Lbr Len/2nd Weight Sex Type Anes PTL Lv  6 Current           5 SAB 2022          4 SAB 2020          3 Term 07/31/17    F Vag-Spont   LIV  2 Term 08/20/12    F Vag-Spont   LIV  1 SAB 2011            Obstetric Comments  6.5lbs for both     Last pap smear was done 08/01/2024 and was normal  Past Medical History:  Diagnosis Date   Anxiety    Gallstones    Heart murmur    HPV in female    Past Surgical History:  Procedure Laterality Date   MOUTH SURGERY     WISDOM TOOTH EXTRACTION     Family History  Problem Relation Age of Onset   Lupus Mother    COPD Mother    Asthma Mother    Cancer Father    Social History   Tobacco Use   Smoking status: Every Day   Smokeless tobacco: Never  Vaping Use   Vaping status: Never Used  Substance Use Topics   Alcohol use: No   Drug use: No   Allergies  Allergen Reactions   Mucinex Sinus-Max Cng-Pr-Pn-Cg [Oxymetazoline & Pe-Apap-Gg-Dm] Hives   Current Outpatient Medications on File Prior to Visit  Medication Sig Dispense Refill   escitalopram  (LEXAPRO ) 10 MG tablet Take 1 tablet (10 mg total) by mouth  daily. 30 tablet 3   Prenatal Vit-Fe Fumarate-FA (MULTIVITAMIN-PRENATAL) 27-0.8 MG TABS tablet Take 1 tablet by mouth daily at 12 noon.     promethazine  (PHENERGAN ) 25 MG tablet Take 1 tablet (25 mg total) by mouth every 6 (six) hours as needed for nausea or vomiting. 30 tablet 1   nitrofurantoin, macrocrystal-monohydrate, (MACROBID) 100 MG capsule Take 1 capsule (100 mg total) by mouth 2 (two) times daily. (Patient not taking: Reported on 08/28/2024) 10 capsule 0   No current facility-administered medications on file prior to visit.    Review of Systems Pertinent items noted in HPI and remainder of comprehensive ROS otherwise negative.  Indications for ASA therapy (per UpToDate) One of the following: (Needs 162 mg daily) Previous pregnancy with preeclampsia, especially early onset and with an adverse outcome No Chronic hypertension No Type 1 or 2 diabetes mellitus No Multifetal gestation No Chronic kidney disease No Autoimmune disease (antiphospholipid syndrome, systemic lupus erythematosus) No Two or more of the following: (Can do 81 mg daily) Nulliparity No Obesity (body mass index >30  kg/m2) Yes Family history of preeclampsia in mother or sister No Age >=35 years No Sociodemographic characteristics (African American race, low socioeconomic level) No Personal risk factors (eg, previous pregnancy with low birth weight or small for gestational age infant, previous adverse pregnancy outcome [eg, stillbirth], interval >10 years between pregnancies) No In vitro conception No  Physical Exam:   Vitals:   08/28/24 1403  BP: 126/84  Pulse: 96  Weight: 174 lb (78.9 kg)   Fetal Heart Rate (bpm): 154   General: well-developed, well-nourished female in no acute distress  Skin: normal coloration and turgor, no rashes  Neurologic: oriented, normal, negative, normal mood  Extremities: normal strength, tone, and muscle mass, ROM of all joints is normal  HEENT PERRLA, extraocular movement  intact and sclera clear, anicteric  Neck supple and no masses  Cardiovascular: regular rate and rhythm  Respiratory:  no respiratory distress, normal breath sounds  Abdomen: soft, non-tender; bowel sounds normal; no masses,  no organomegaly   Assessment:  Pregnancy: [id number] Patient Active Problem List   Diagnosis Date Noted   Supervision of high risk pregnancy, antepartum 08/03/2024   History of biliary colic 07/31/2024   Anxiety and depression 07/31/2024   BMI 30.0-30.9,adult 07/31/2024   Obesity in pregnancy 07/31/2024    Plan:  1. Supervision of high risk pregnancy, antepartum (Primary) - Patient overall doing well.  - Reports occasional flutters and fetal movement.  2. [redacted] weeks gestation of pregnancy - Labor and Delivery expectations for [place] hospital birth discussed in depth.  - Reassured patient that she has options here with [facility] and that there are protocols and procedures in place to help keep her and her fetus safe.  - Patient interested in WB.  - Reviewed conditions in labor that will risk her out of water immersion including thick meconium or blood stained amniotic fluid, non-reassuring fetal status on monitor, excessive bleeding, hypertension, dizziness, use of IV meds, damaged equipment or staffing that does not allow for water immersion, etc.  - Discussed other labor support options if waterbirth becomes unavailable, including position change, freedom of movement, use of birthing ball, and/or use of hydrotherapy in the shower (dependent upon medical condition/provider discretion).    3. Anxiety about health - Other Concerns addressed.  - Reviewed that SOB during eating my be gall bladder related. Encouraged to monitor diet and to eat small frequent meals instead of 3 large ones.  - Also addressed concerns for prominent veins in feet. Discussed normal increases in blood volume during pregnancy and normal hemodilution that occurs that may also be contributing to SOB with  eating.  - Continue to monitor.    Initial labs drawn. Continue prenatal vitamins. Problem list reviewed and updated. Genetic Screening discussed, Panorama and Horizon: requested. Ultrasound discussed; fetal anatomic survey: planned. Anticipatory guidance about prenatal visits given including labs, ultrasounds, and testing. Weight gain recommendations per IOM guidelines reviewed: underweight/BMI 18.5 or less > 28 - 40 lbs; normal weight/BMI 18.5 - 24.9 > 25 - 35 lbs; overweight/BMI 25 - 29.9 > 15 - 25 lbs; obese/BMI 30 or more > 11 - 20 lbs. Discussed usage of the Babyscripts app for more information about pregnancy, and to track blood pressures. Also discussed usage of virtual visits as additional source of managing and completing prenatal visits.  Patient was encouraged to use MyChart to review results, send requests, and have questions addressed.   The nature of [facility] with multiple MDs and Advanced Practice Providers was explained to  patient; also emphasized that residents, students are part of our team. Routine obstetric precautions reviewed. Encouraged to seek out care at our office or emergency room [facility] preferred) for urgent and/or emergent concerns. Return in about 4 weeks (around 09/25/2024) for LOB.  Ponce Skillman Erven) Emilio, MSN, CNM  [facility]  08/28/2024 5:29 PM

## 2024-08-28 NOTE — Progress Notes (Signed)
 ROB   CC: lower abdominal pain, SOB with eating, notes having gallstones. ED visit on 08/13/24 also had U/S done to confirm

## 2024-09-17 ENCOUNTER — Telehealth: Admitting: Family Medicine

## 2024-09-17 NOTE — Progress Notes (Signed)
 For the safety of you and your child, I recommend a face to face office visit with a health care provider.  Many mothers need to take medicines during their pregnancy and while nursing.  Almost all medicines pass into the breast milk in small quantities.  Most are generally considered safe for a mother to take but some medicines must be avoided.  After reviewing your E-Visit request, I recommend that you consult your OB/GYN or pediatrician for medical advice in relation to your condition and prescription medications while pregnant or breastfeeding.  NOTE: There will be NO CHARGE for this E-Visit   If you are having a true medical emergency, please call 911.

## 2024-09-25 ENCOUNTER — Ambulatory Visit: Admitting: Certified Nurse Midwife

## 2024-09-25 VITALS — BP 113/81 | HR 98 | Wt 174.2 lb

## 2024-09-25 DIAGNOSIS — Z3482 Encounter for supervision of other normal pregnancy, second trimester: Secondary | ICD-10-CM

## 2024-09-25 DIAGNOSIS — K5900 Constipation, unspecified: Secondary | ICD-10-CM | POA: Diagnosis not present

## 2024-09-25 DIAGNOSIS — Z3493 Encounter for supervision of normal pregnancy, unspecified, third trimester: Secondary | ICD-10-CM

## 2024-09-25 DIAGNOSIS — Z3A18 18 weeks gestation of pregnancy: Secondary | ICD-10-CM

## 2024-09-25 NOTE — Patient Instructions (Signed)
 You have constipation which is hard stools that are difficult to pass. It is important to have regular bowel movements every 1-3 days that are soft and easy to pass. Hard stools increase your risk of hemorrhoids and are very uncomfortable.   To prevent constipation you can increase the amount of fiber in your diet. Examples of foods with fiber are leafy greens, whole grain breads, oatmeal and other grains.  It is also important to drink at least eight 8oz glass of water everyday.   If you have not has a bowel movement in 4-5 days you made need to clean out your bowel.  This will have establish normal movement through your bowel.    Miralax Clean out  Take 8 capfuls of miralax in 64 oz of gatorade. You can use any fluid that appeals to you (gatorade, water, juice)  Continue to drink at least eight 8 oz glasses of water throughout the day  You can repeat with another 8 capfuls of miralax in 64 oz of gatorade if you are not having a large amount of stools  You will need to be at home and close to a bathroom for about 8 hours when you do the above as you may need to go to the bathroom frequently.   After you are cleaned out: - Start Colace100mg  twice daily - Start Miralax once daily - Start a daily fiber supplement like metamucil or citrucel - You can safely use enemas in pregnancy  - if you are having diarrhea you can reduce to Colace once a day or miralax every other day or a 1/2 capful daily.

## 2024-09-25 NOTE — Progress Notes (Signed)
   PRENATAL VISIT NOTE  Subjective:  Sandra Khan is a 33 y.o. [id number] at [redacted]w[redacted]d being seen today for ongoing prenatal care.  She is currently monitored for the following issues for this low-risk pregnancy and has History of biliary colic; Anxiety and depression; BMI 30.0-30.9,adult; Obesity in pregnancy; and Supervision of high risk pregnancy, antepartum on their problem list.  Patient reports constipation. Patient reports taking an additional iron supplement daily. .   SABRA Hire. Bleeding: None.  Movement: Absent. Denies leaking of fluid.   The following portions of the patient's history were reviewed and updated as appropriate: allergies, current medications, past family history, past medical history, past social history, past surgical history and problem list.   Objective:   Vitals:   09/25/24 0938  BP: 113/81  Pulse: 98  Weight: 174 lb 4 oz (79 kg)    Fetal Status:  Fetal Heart Rate (bpm): 154   Movement: Absent    General: Alert, oriented and cooperative. Patient is in no acute distress.  Skin: Skin is warm and dry. No rash noted.   Cardiovascular: Normal heart rate noted  Respiratory: Normal respiratory effort, no problems with respiration noted  Abdomen: Soft, gravid, appropriate for gestational age.  Pain/Pressure: Present     Pelvic: Cervical exam deferred        Extremities: Normal range of motion.     Mental Status: Normal mood and affect. Normal behavior. Normal judgment and thought content.      07/31/2024   10:28 AM  Depression screen PHQ 2/9  Decreased Interest 3  Down, Depressed, Hopeless 0  PHQ - 2 Score 3  Altered sleeping 0  Tired, decreased energy 3  Change in appetite 3  Feeling bad or failure about yourself  0  Trouble concentrating 0  Moving slowly or fidgety/restless 0  Suicidal thoughts 0  PHQ-9 Score 9      Data saved with a previous flowsheet row definition        07/31/2024   10:28 AM  GAD 7 : Generalized Anxiety Score  Nervous, Anxious,  on Edge 3  Control/stop worrying 2  Worry too much - different things 2  Trouble relaxing 1  Restless 0  Easily annoyed or irritable 0  Afraid - awful might happen 2  Total GAD 7 Score 10    Assessment and Plan:  Pregnancy: [id number] at [redacted]w[redacted]d 1. Encounter for supervision of low-risk pregnancy in third trimester (Primary) - Patient doing well.  - Reports feeling fetal movement   2. [redacted] weeks gestation of pregnancy - Reviewed 2nd trimester expectations for Fetal movement.   3. Constipation, unspecified constipation type - recommended to decrease oral iron to every other day and or once every 3rd day.  - Also recommended the use of a daily stool softener.   Preterm labor symptoms and general obstetric precautions including but not limited to vaginal bleeding, contractions, leaking of fluid and fetal movement were reviewed in detail with the patient. Please refer to After Visit Summary for other counseling recommendations.   Return in about 4 weeks (around 10/23/2024) for LOB.  Future Appointments  Date Time Provider Department Center  10/02/2024  9:00 AM [facility] PROVIDER 1 [facility]  10/02/2024  9:30 AM [facility] US4 [facility]  10/23/2024  9:35 AM Emilio Delilah HERO, CNM [facility]  11/20/2024  9:35 AM Letha Renshaw, CNM [facility]   Annelie Boak Erven) Emilio, MSN, CNM  Center for [facility]  09/25/2024 12:25 PM

## 2024-09-30 ENCOUNTER — Other Ambulatory Visit: Payer: Self-pay | Admitting: *Deleted

## 2024-09-30 DIAGNOSIS — O0990 Supervision of high risk pregnancy, unspecified, unspecified trimester: Secondary | ICD-10-CM

## 2024-10-02 ENCOUNTER — Other Ambulatory Visit: Payer: Self-pay | Admitting: *Deleted

## 2024-10-02 ENCOUNTER — Ambulatory Visit: Attending: Obstetrics and Gynecology | Admitting: Obstetrics and Gynecology

## 2024-10-02 ENCOUNTER — Ambulatory Visit (HOSPITAL_BASED_OUTPATIENT_CLINIC_OR_DEPARTMENT_OTHER)

## 2024-10-02 VITALS — BP 125/81 | HR 89

## 2024-10-02 DIAGNOSIS — O99210 Obesity complicating pregnancy, unspecified trimester: Secondary | ICD-10-CM | POA: Diagnosis not present

## 2024-10-02 DIAGNOSIS — Z6830 Body mass index (BMI) 30.0-30.9, adult: Secondary | ICD-10-CM

## 2024-10-02 DIAGNOSIS — F419 Anxiety disorder, unspecified: Secondary | ICD-10-CM | POA: Insufficient documentation

## 2024-10-02 DIAGNOSIS — Z362 Encounter for other antenatal screening follow-up: Secondary | ICD-10-CM

## 2024-10-02 DIAGNOSIS — O99212 Obesity complicating pregnancy, second trimester: Secondary | ICD-10-CM | POA: Diagnosis not present

## 2024-10-02 DIAGNOSIS — Z79899 Other long term (current) drug therapy: Secondary | ICD-10-CM | POA: Insufficient documentation

## 2024-10-02 DIAGNOSIS — O99342 Other mental disorders complicating pregnancy, second trimester: Secondary | ICD-10-CM | POA: Insufficient documentation

## 2024-10-02 DIAGNOSIS — O0990 Supervision of high risk pregnancy, unspecified, unspecified trimester: Secondary | ICD-10-CM | POA: Diagnosis present

## 2024-10-02 DIAGNOSIS — E669 Obesity, unspecified: Secondary | ICD-10-CM | POA: Diagnosis not present

## 2024-10-02 DIAGNOSIS — F32A Depression, unspecified: Secondary | ICD-10-CM

## 2024-10-02 DIAGNOSIS — Z3A19 19 weeks gestation of pregnancy: Secondary | ICD-10-CM

## 2024-10-02 NOTE — Progress Notes (Signed)
 Maternal-Fetal Medicine Consultation  Name: Sandra Khan  MRN: [id number]  GA: [id number] [redacted]w[redacted]d   Patient is here for fetal anatomy scan.  On cell-free fetal DNA screening, the risks of aneuploidies are not increased.  Obstetric history significant for 2 term vaginal deliveries.  Both her children good health. Patient takes Lexapro .    Ultrasound Fetal biometry is consistent with her previously-established dates. Normal amniotic fluid.  No makers of aneuploidies or fetal structural defects are seen.  Fetal anatomical survey is could not be completed because of fetal position.  Patient understands the limitations of ultrasound in detecting fetal anomalies.  As maternal obesity imposes limitations on the resolution of images, fetal anomalies may be missed.  Lexapro  (Escitalopram ) No increased fetal congenital malformations have been reported with the use of this drug in early pregnancy. A case-controlled study suggested the association of primary pulmonary hypertension (PPHN). However, the absolute risk is very small (less than 1%). Also, in the absence of other conditions like meconium aspiration syndrome and congenital diaphragmatic hernia, the chances of PPHN are low and even if they occur they tend to resolve with treatment.  About 10% to 30% of neonates exposed to SSRIs in utero tend to develop poor neonatal adaptation syndrome (irritability, tremor, seizures and resembling withdrawal from opioids) that resolve without any long-term neurological adverse outcomes.  In pregnant women, we recommend continuation of this medication to avoid adverse outcomes associated with discontinuation.  Recommendations - An appointment was made for her to return in 5 weeks for completion of fetal anatomy.     Consultation including face-to-face (more than 50%) counseling 30 minutes.

## 2024-10-23 ENCOUNTER — Ambulatory Visit (INDEPENDENT_AMBULATORY_CARE_PROVIDER_SITE_OTHER): Admitting: Certified Nurse Midwife

## 2024-10-23 VITALS — BP 123/81 | HR 103 | Wt 178.4 lb

## 2024-10-23 DIAGNOSIS — Z3A22 22 weeks gestation of pregnancy: Secondary | ICD-10-CM

## 2024-10-23 DIAGNOSIS — Z3492 Encounter for supervision of normal pregnancy, unspecified, second trimester: Secondary | ICD-10-CM | POA: Diagnosis not present

## 2024-10-23 NOTE — Progress Notes (Signed)
° °  PRENATAL VISIT NOTE  Subjective:  Sandra Khan is a 33 y.o. [id number] at [redacted]w[redacted]d being seen today for ongoing prenatal care.  She is currently monitored for the following issues for this low-risk pregnancy and has History of biliary colic; Anxiety and depression; BMI 30.0-30.9,adult; Obesity in pregnancy; and Supervision of high risk pregnancy, antepartum on their problem list.  Patient reports no complaints.  Contractions: Not present. Vag. Bleeding: None.  Movement: Present. Denies leaking of fluid.   The following portions of the patient's history were reviewed and updated as appropriate: allergies, current medications, past family history, past medical history, past social history, past surgical history and problem list.   Objective:   Vitals:   10/23/24 0951  BP: 123/81  Pulse: (!) 103  Weight: 178 lb 6 oz (80.9 kg)    Fetal Status:  Fetal Heart Rate (bpm): 156   Movement: Present    General: Alert, oriented and cooperative. Patient is in no acute distress.  Skin: Skin is warm and dry. No rash noted.   Cardiovascular: Normal heart rate noted  Respiratory: Normal respiratory effort, no problems with respiration noted  Abdomen: Soft, gravid, appropriate for gestational age.  Pain/Pressure: Absent     Pelvic: Cervical exam deferred        Extremities: Normal range of motion.     Mental Status: Normal mood and affect. Normal behavior. Normal judgment and thought content.      07/31/2024   10:28 AM  Depression screen PHQ 2/9  Decreased Interest 3  Down, Depressed, Hopeless 0  PHQ - 2 Score 3  Altered sleeping 0  Tired, decreased energy 3  Change in appetite 3  Feeling bad or failure about yourself  0  Trouble concentrating 0  Moving slowly or fidgety/restless 0  Suicidal thoughts 0  PHQ-9 Score 9      Data saved with a previous flowsheet row definition        07/31/2024   10:28 AM  GAD 7 : Generalized Anxiety Score  Nervous, Anxious, on Edge 3  Control/stop  worrying 2  Worry too much - different things 2  Trouble relaxing 1  Restless 0  Easily annoyed or irritable 0  Afraid - awful might happen 2  Total GAD 7 Score 10    Assessment and Plan:  Pregnancy: [id number] at [redacted]w[redacted]d 1. Encounter for supervision of low-risk pregnancy in second trimester (Primary) - Patient doing well.  - Reports frequent and vigorous fetal movement   2. [redacted] weeks gestation of pregnancy - 2nd trimester expectations reviewed.   Preterm labor symptoms and general obstetric precautions including but not limited to vaginal bleeding, contractions, leaking of fluid and fetal movement were reviewed in detail with the patient. Please refer to After Visit Summary for other counseling recommendations.   Return in about 6 weeks (around 12/04/2024) for LOB w GTT.  Future Appointments  Date Time Provider Department Center  11/11/2024 11:15 AM [facility] PROVIDER 1 [facility]  11/11/2024 11:30 AM [facility] US4 [facility]  12/04/2024  8:00 AM [facility] LAB [facility]  12/04/2024  8:55 AM Emilio Delilah HERO, CNM [facility]    Olubunmi Rothenberger Erven) Emilio, MSN, CNM  Center for [facility]  10/23/2024 10:57 AM

## 2024-10-30 ENCOUNTER — Telehealth: Admitting: Family Medicine

## 2024-10-30 DIAGNOSIS — R051 Acute cough: Secondary | ICD-10-CM

## 2024-10-30 DIAGNOSIS — R0981 Nasal congestion: Secondary | ICD-10-CM

## 2024-10-30 DIAGNOSIS — Z3A23 23 weeks gestation of pregnancy: Secondary | ICD-10-CM

## 2024-10-30 NOTE — Progress Notes (Signed)

## 2024-11-11 ENCOUNTER — Ambulatory Visit (HOSPITAL_BASED_OUTPATIENT_CLINIC_OR_DEPARTMENT_OTHER)

## 2024-11-11 ENCOUNTER — Ambulatory Visit: Attending: Obstetrics and Gynecology | Admitting: Obstetrics

## 2024-11-11 VITALS — BP 121/67 | HR 78

## 2024-11-11 DIAGNOSIS — F419 Anxiety disorder, unspecified: Secondary | ICD-10-CM | POA: Insufficient documentation

## 2024-11-11 DIAGNOSIS — O99342 Other mental disorders complicating pregnancy, second trimester: Secondary | ICD-10-CM | POA: Diagnosis not present

## 2024-11-11 DIAGNOSIS — F418 Other specified anxiety disorders: Secondary | ICD-10-CM | POA: Diagnosis not present

## 2024-11-11 DIAGNOSIS — O99212 Obesity complicating pregnancy, second trimester: Secondary | ICD-10-CM | POA: Diagnosis not present

## 2024-11-11 DIAGNOSIS — Z362 Encounter for other antenatal screening follow-up: Secondary | ICD-10-CM

## 2024-11-11 DIAGNOSIS — Z3A25 25 weeks gestation of pregnancy: Secondary | ICD-10-CM | POA: Diagnosis not present

## 2024-11-11 DIAGNOSIS — Z3689 Encounter for other specified antenatal screening: Secondary | ICD-10-CM | POA: Diagnosis not present

## 2024-11-11 DIAGNOSIS — O99210 Obesity complicating pregnancy, unspecified trimester: Secondary | ICD-10-CM

## 2024-11-11 NOTE — Progress Notes (Signed)
 MFM Consult Note  Sandra Khan is currently at [redacted]w[redacted]d. She was seen to complete the views of the fetal anatomy which were limited during her prior ultrasound exam.  She has a history of anxiety and depression that is currently treated with Lexapro .    She denies any problems since her last exam.  Her blood pressure today was 121/67.  Sonographic findings Single intrauterine pregnancy at 25w 1d.  Fetal cardiac activity:  Observed and appears normal. Presentation: Cephalic. There were no obvious fetal anomalies noted on today's exam.  The limitations of ultrasound in the detection of all anomalies was discussed. Fetal biometry shows the estimated fetal weight of 1 lb 14 oz,  838g (64%). Amniotic fluid volume: Within normal limits. AFI: cm.  MVP: 5.56 cm. Placenta: Anterior.  As the views of the fetal anatomy were visualized today and the fetal growth is within normal limits, no further exams were scheduled in our office.    She was advised to continue routine prenatal care.  The patient stated that all of her questions were answered.   A total of 20 minutes was spent counseling and coordinating the care for this patient.  Greater than 50% of the time was spent in direct face-to-face contact.

## 2024-11-20 ENCOUNTER — Encounter: Admitting: Obstetrics and Gynecology

## 2024-12-04 ENCOUNTER — Ambulatory Visit: Admitting: Certified Nurse Midwife

## 2024-12-04 ENCOUNTER — Other Ambulatory Visit

## 2024-12-04 VITALS — BP 125/81 | HR 73 | Wt 182.0 lb

## 2024-12-04 DIAGNOSIS — R102 Pelvic and perineal pain unspecified side: Secondary | ICD-10-CM

## 2024-12-04 DIAGNOSIS — Z8719 Personal history of other diseases of the digestive system: Secondary | ICD-10-CM | POA: Diagnosis not present

## 2024-12-04 DIAGNOSIS — O26893 Other specified pregnancy related conditions, third trimester: Secondary | ICD-10-CM | POA: Diagnosis not present

## 2024-12-04 DIAGNOSIS — Z3A28 28 weeks gestation of pregnancy: Secondary | ICD-10-CM

## 2024-12-04 DIAGNOSIS — Z3403 Encounter for supervision of normal first pregnancy, third trimester: Secondary | ICD-10-CM

## 2024-12-04 DIAGNOSIS — R12 Heartburn: Secondary | ICD-10-CM

## 2024-12-04 DIAGNOSIS — Z3492 Encounter for supervision of normal pregnancy, unspecified, second trimester: Secondary | ICD-10-CM

## 2024-12-04 MED ORDER — FAMOTIDINE 20 MG PO TABS: 20.0000 mg | ORAL_TABLET | Freq: Every day | ORAL | 0 refills | Status: AC

## 2024-12-04 NOTE — Progress Notes (Signed)
 "  PRENATAL VISIT NOTE  Subjective:  Sandra Khan is a 34 y.o. [id number] at [redacted]w[redacted]d being seen today for ongoing prenatal care.  She is currently monitored for the following issues for this low-risk pregnancy and has History of biliary colic; Anxiety and depression; BMI 30.0-30.9,adult; Obesity in pregnancy; and Supervision of high risk pregnancy, antepartum on their problem list.  Patient reports heartburn.  Contractions: Not present. Vag. Bleeding: None.  Movement: Present. Denies leaking of fluid.   The following portions of the patient's history were reviewed and updated as appropriate: allergies, current medications, past family history, past medical history, past social history, past surgical history and problem list.   Objective:   Vitals:   12/04/24 0853  BP: 125/81  Pulse: 73  Weight: 182 lb (82.6 kg)    Fetal Status:      Movement: Present    General: Alert, oriented and cooperative. Patient is in no acute distress.  Skin: Skin is warm and dry. No rash noted.   Cardiovascular: Normal heart rate noted  Respiratory: Normal respiratory effort, no problems with respiration noted  Abdomen: Soft, gravid, appropriate for gestational age.  Pain/Pressure: Present     Pelvic: Cervical exam deferred        Extremities: Normal range of motion.     Mental Status: Normal mood and affect. Normal behavior. Normal judgment and thought content.      07/31/2024   10:28 AM  Depression screen PHQ 2/9  Decreased Interest 3  Down, Depressed, Hopeless 0  PHQ - 2 Score 3  Altered sleeping 0  Tired, decreased energy 3  Change in appetite 3  Feeling bad or failure about yourself  0  Trouble concentrating 0  Moving slowly or fidgety/restless 0  Suicidal thoughts 0  PHQ-9 Score 9      Data saved with a previous flowsheet row definition        07/31/2024   10:28 AM  GAD 7 : Generalized Anxiety Score  Nervous, Anxious, on Edge 3   Control/stop worrying 2   Worry too much - different  things 2   Trouble relaxing 1   Restless 0   Easily annoyed or irritable 0   Afraid - awful might happen 2   Total GAD 7 Score 10     Data saved with a previous flowsheet row definition    Assessment and Plan:  Pregnancy: [id number] at [redacted]w[redacted]d 1. Encounter for supervision of low-risk pregnancy in second trimester (Primary) - Patient doing well.  - Reports frequent and vigorous fetal movement   2. [redacted] weeks gestation of pregnancy - GTT collected today.   3. Heartburn during pregnancy in second trimester - Rx for Pepcid  sent to outpatient pharmacy.   4. Pelvic pain - Reviewed pelvic discomfort as a normal discomfort of pregnancy - Recommended the use of a maternity support belt and or KT tape.    5. History of biliary colic - Given hx CMET added on to labs today.   Preterm labor symptoms and general obstetric precautions including but not limited to vaginal bleeding, contractions, leaking of fluid and fetal movement were reviewed in detail with the patient. Please refer to After Visit Summary for other counseling recommendations.   Return in about 2 weeks (around 12/18/2024) for LOB.  Future Appointments  Date Time Provider Department Center  12/18/2024  1:10 PM Emilio Delilah HERO, CNM [facility]  01/01/2025 10:15 AM Letha Renshaw, CNM [facility]  01/15/2025 10:35 AM Emilio Delilah HERO, CNM [facility]  [facility]  01/29/2025 10:55 AM Emilio Delilah HERO, CNM [facility]  02/07/2025  8:55 AM Eldonna Suzen Octave, MD [facility]  02/14/2025  8:35 AM Izell Harari, MD [facility]  02/21/2025  8:35 AM Eldonna, Suzen Octave, MD [facility]    Kaleah Hagemeister Erven) Emilio, MSN, CNM  Center for [facility]  12/04/2024 12:42 PM    "

## 2024-12-04 NOTE — Progress Notes (Signed)
 Pt complains of back and pelvic pain, would like Rx for heartburn/reflux.

## 2024-12-05 LAB — COMPREHENSIVE METABOLIC PANEL WITH GFR
ALT: 8 IU/L (ref 0–32)
AST: 12 IU/L (ref 0–40)
Albumin: 3.6 g/dL — ABNORMAL LOW (ref 3.9–4.9)
Alkaline Phosphatase: 79 IU/L (ref 41–116)
BUN/Creatinine Ratio: 11 (ref 9–23)
BUN: 5 mg/dL — ABNORMAL LOW (ref 6–20)
Bilirubin Total: <0.2 mg/dL (ref 0.0–1.2)
CO2: 19 mmol/L — ABNORMAL LOW (ref 20–29)
Calcium: 8.9 mg/dL (ref 8.7–10.2)
Chloride: 104 mmol/L (ref 96–106)
Creatinine, Ser: 0.46 mg/dL — ABNORMAL LOW (ref 0.57–1.00)
Globulin, Total: 2.4 g/dL (ref 1.5–4.5)
Glucose: 94 mg/dL (ref 70–99)
Potassium: 3.8 mmol/L (ref 3.5–5.2)
Sodium: 139 mmol/L (ref 134–144)
Total Protein: 6 g/dL (ref 6.0–8.5)
eGFR: 129 mL/min/1.73

## 2024-12-05 LAB — CBC
Hematocrit: 35.3 % (ref 34.0–46.6)
Hemoglobin: 11.2 g/dL (ref 11.1–15.9)
MCH: 27.9 pg (ref 26.6–33.0)
MCHC: 31.7 g/dL (ref 31.5–35.7)
MCV: 88 fL (ref 79–97)
Platelets: 260 x10E3/uL (ref 150–450)
RBC: 4.01 x10E6/uL (ref 3.77–5.28)
RDW: 13.2 % (ref 11.7–15.4)
WBC: 12.7 x10E3/uL — ABNORMAL HIGH (ref 3.4–10.8)

## 2024-12-05 LAB — HIV ANTIBODY (ROUTINE TESTING W REFLEX): HIV Screen 4th Generation wRfx: NONREACTIVE

## 2024-12-05 LAB — GLUCOSE TOLERANCE, 2 HOURS W/ 1HR
Glucose, 1 hour: 187 mg/dL — ABNORMAL HIGH (ref 70–179)
Glucose, 2 hour: 172 mg/dL — ABNORMAL HIGH (ref 70–152)
Glucose, Fasting: 96 mg/dL — ABNORMAL HIGH (ref 70–91)

## 2024-12-05 LAB — SYPHILIS: RPR W/REFLEX TO RPR TITER AND TREPONEMAL ANTIBODIES, TRADITIONAL SCREENING AND DIAGNOSIS ALGORITHM: RPR Ser Ql: NONREACTIVE

## 2024-12-09 ENCOUNTER — Ambulatory Visit: Payer: Self-pay | Admitting: Certified Nurse Midwife

## 2024-12-09 ENCOUNTER — Other Ambulatory Visit: Payer: Self-pay | Admitting: *Deleted

## 2024-12-09 DIAGNOSIS — O24410 Gestational diabetes mellitus in pregnancy, diet controlled: Secondary | ICD-10-CM

## 2024-12-09 DIAGNOSIS — O24419 Gestational diabetes mellitus in pregnancy, unspecified control: Secondary | ICD-10-CM

## 2024-12-09 MED ORDER — ACCU-CHEK SOFTCLIX LANCETS MISC: 1.0000 | Freq: Four times a day (QID) | 12 refills | Status: AC

## 2024-12-09 MED ORDER — ACCU-CHEK GUIDE W/DEVICE KIT: 1.0000 | PACK | Freq: Four times a day (QID) | 0 refills | Status: AC

## 2024-12-09 MED ORDER — GLUCOSE BLOOD VI STRP: ORAL_STRIP | 12 refills | Status: AC

## 2024-12-11 ENCOUNTER — Other Ambulatory Visit: Payer: Self-pay | Admitting: Certified Nurse Midwife

## 2024-12-11 DIAGNOSIS — O24410 Gestational diabetes mellitus in pregnancy, diet controlled: Secondary | ICD-10-CM

## 2024-12-17 DIAGNOSIS — O24419 Gestational diabetes mellitus in pregnancy, unspecified control: Secondary | ICD-10-CM | POA: Insufficient documentation

## 2024-12-18 ENCOUNTER — Ambulatory Visit: Admitting: Certified Nurse Midwife

## 2024-12-18 ENCOUNTER — Encounter: Payer: Self-pay | Admitting: Dietician

## 2024-12-18 ENCOUNTER — Encounter

## 2024-12-18 VITALS — BP 123/81 | HR 90 | Wt 181.0 lb

## 2024-12-18 DIAGNOSIS — Z3A30 30 weeks gestation of pregnancy: Secondary | ICD-10-CM | POA: Diagnosis not present

## 2024-12-18 DIAGNOSIS — O24410 Gestational diabetes mellitus in pregnancy, diet controlled: Secondary | ICD-10-CM

## 2024-12-18 DIAGNOSIS — Z3493 Encounter for supervision of normal pregnancy, unspecified, third trimester: Secondary | ICD-10-CM

## 2024-12-18 NOTE — Progress Notes (Signed)
 "  PRENATAL VISIT NOTE  Subjective:  Sandra Khan is a 34 y.o. [id number] at [redacted]w[redacted]d being seen today for ongoing prenatal care.  She is currently monitored for the following issues for this high-risk pregnancy and has History of biliary colic; Anxiety and depression; BMI 30.0-30.9,adult; Obesity in pregnancy; Supervision of high risk pregnancy, antepartum; and Gestational diabetes on their problem list.  Patient reports no complaints.  Contractions: Not present. Vag. Bleeding: None.  Movement: Present. Denies leaking of fluid.   The following portions of the patient's history were reviewed and updated as appropriate: allergies, current medications, past family history, past medical history, past social history, past surgical history and problem list.   Objective:   Vitals:   12/18/24 1319  BP: 123/81  Pulse: 90  Weight: 181 lb (82.1 kg)    Fetal Status:  Fetal Heart Rate (bpm): 138   Movement: Present    General: Alert, oriented and cooperative. Patient is in no acute distress.  Skin: Skin is warm and dry. No rash noted.   Cardiovascular: Normal heart rate noted  Respiratory: Normal respiratory effort, no problems with respiration noted  Abdomen: Soft, gravid, appropriate for gestational age.  Pain/Pressure: Present     Pelvic: Cervical exam deferred        Extremities: Normal range of motion.     Mental Status: Normal mood and affect. Normal behavior. Normal judgment and thought content.      07/31/2024   10:28 AM  Depression screen PHQ 2/9  Decreased Interest 3  Down, Depressed, Hopeless 0  PHQ - 2 Score 3  Altered sleeping 0  Tired, decreased energy 3  Change in appetite 3  Feeling bad or failure about yourself  0  Trouble concentrating 0  Moving slowly or fidgety/restless 0  Suicidal thoughts 0  PHQ-9 Score 9      Data saved with a previous flowsheet row definition        07/31/2024   10:28 AM  GAD 7 : Generalized Anxiety Score  Nervous, Anxious, on Edge 3    Control/stop worrying 2   Worry too much - different things 2   Trouble relaxing 1   Restless 0   Easily annoyed or irritable 0   Afraid - awful might happen 2   Total GAD 7 Score 10     Data saved with a previous flowsheet row definition       Assessment and Plan:  Pregnancy: [id number] at [redacted]w[redacted]d 1. Encounter for supervision of low-risk pregnancy in third trimester (Primary) - Patient doing well.  - Reports vigorous and frequent Fetal movement.   2. [redacted] weeks gestation of pregnancy - 3rd trimester expectations reviewed.  3. Gestational diabetes mellitus, class A1 - diabetes education class completed.   4. Diet controlled gestational diabetes mellitus (GDM) in third trimester - >80% of CBG within normal rage with nly 2-3 outliers.  - Recommended to continue with diet controlled management at this time.   Preterm labor symptoms and general obstetric precautions including but not limited to vaginal bleeding, contractions, leaking of fluid and fetal movement were reviewed in detail with the patient. Please refer to After Visit Summary for other counseling recommendations.   Return in about 2 weeks (around 01/01/2025) for HROB with CNM or MD.  Future Appointments  Date Time Provider Department Center  01/01/2025 10:15 AM Letha Renshaw, CNM [facility]  01/15/2025 10:35 AM Emilio Delilah HERO, CNM [facility]  01/29/2025 10:55 AM Emilio Delilah HERO, CNM [facility]  02/07/2025  8:55 AM Eldonna Suzen Octave, MD [facility]  02/14/2025  8:35 AM Izell Harari, MD [facility]  02/21/2025  8:35 AM Eldonna, Suzen Octave, MD [facility]    Amerigo Mcglory Erven) Emilio, MSN, CNM  Center for [facility]  12/18/2024 5:26 PM    "

## 2024-12-18 NOTE — Progress Notes (Signed)
 Class start Time: 0900   Class End Time: 1000  This was a class of 4 patients.   Patient was seen on 12/18/24 for Gestational Diabetes self-management class at the [facility]. The following learning objectives were met by the patient during this course:  States the definition of Gestational Diabetes States why dietary management is important in controlling blood glucose Describes the effects each nutrient has on blood glucose levels Demonstrates ability to create a balanced meal plan Demonstrates carbohydrate counting  States when to check blood glucose levels Demonstrates proper blood glucose monitoring techniques States the effect of stress and exercise on blood glucose levels States the importance of limiting caffeine and abstaining from alcohol and smoking   Patient has a meter prior to visit. Patient is testing pre breakfast and 2 hours after each meal. FBS: did not report Postprandial: did not report  Patient instructed to monitor glucose levels:  QID FBS: 60 - <95 1 hour: <140 2 hour: <120  *Patient received handouts: Nutrition Diabetes and Pregnancy Carbohydrate Counting List Blood glucose log Snack ideas for diabetes during pregnancy Plate Planner  Patient will be seen for follow-up as needed.

## 2025-01-01 ENCOUNTER — Encounter: Admitting: Obstetrics and Gynecology

## 2025-01-15 ENCOUNTER — Encounter: Admitting: Certified Nurse Midwife

## 2025-01-29 ENCOUNTER — Encounter: Admitting: Certified Nurse Midwife

## 2025-02-07 ENCOUNTER — Encounter: Admitting: Family Medicine

## 2025-02-14 ENCOUNTER — Encounter: Admitting: Obstetrics and Gynecology

## 2025-02-21 ENCOUNTER — Encounter: Admitting: Family Medicine

## 2025-02-23 ENCOUNTER — Inpatient Hospital Stay (HOSPITAL_COMMUNITY): Admit: 2025-02-23
